# Patient Record
Sex: Male | Born: 1985 | Race: Black or African American | Hispanic: No | Marital: Single | Smoking: Former smoker
Health system: Southern US, Community
[De-identification: ages and names within clinical notes are randomized; demographics above are authoritative.]

## PROBLEM LIST (undated history)

## (undated) DIAGNOSIS — J309 Allergic rhinitis, unspecified: Secondary | ICD-10-CM

## (undated) DIAGNOSIS — M79609 Pain in unspecified limb: Secondary | ICD-10-CM

## (undated) DIAGNOSIS — F419 Anxiety disorder, unspecified: Secondary | ICD-10-CM

## (undated) HISTORY — DX: Pain in unspecified limb: M79.609

## (undated) HISTORY — DX: Anxiety disorder, unspecified: F41.9

## (undated) HISTORY — DX: Allergic rhinitis, unspecified: J30.9

---
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
## 2004-05-31 ENCOUNTER — Ambulatory Visit: Payer: Self-pay | Admitting: Internal Medicine

## 2005-10-12 ENCOUNTER — Emergency Department (HOSPITAL_COMMUNITY): Admission: EM | Admit: 2005-10-12 | Discharge: 2005-10-12 | Payer: Self-pay | Admitting: Family Medicine

## 2007-08-10 IMAGING — CR DG ABDOMEN 1V
3 series · 3 of 3 positions shown · non-contrast
Comparison: None.

CLINICAL DATA: Abdominal pain.

[view not recorded (1 of 3)]
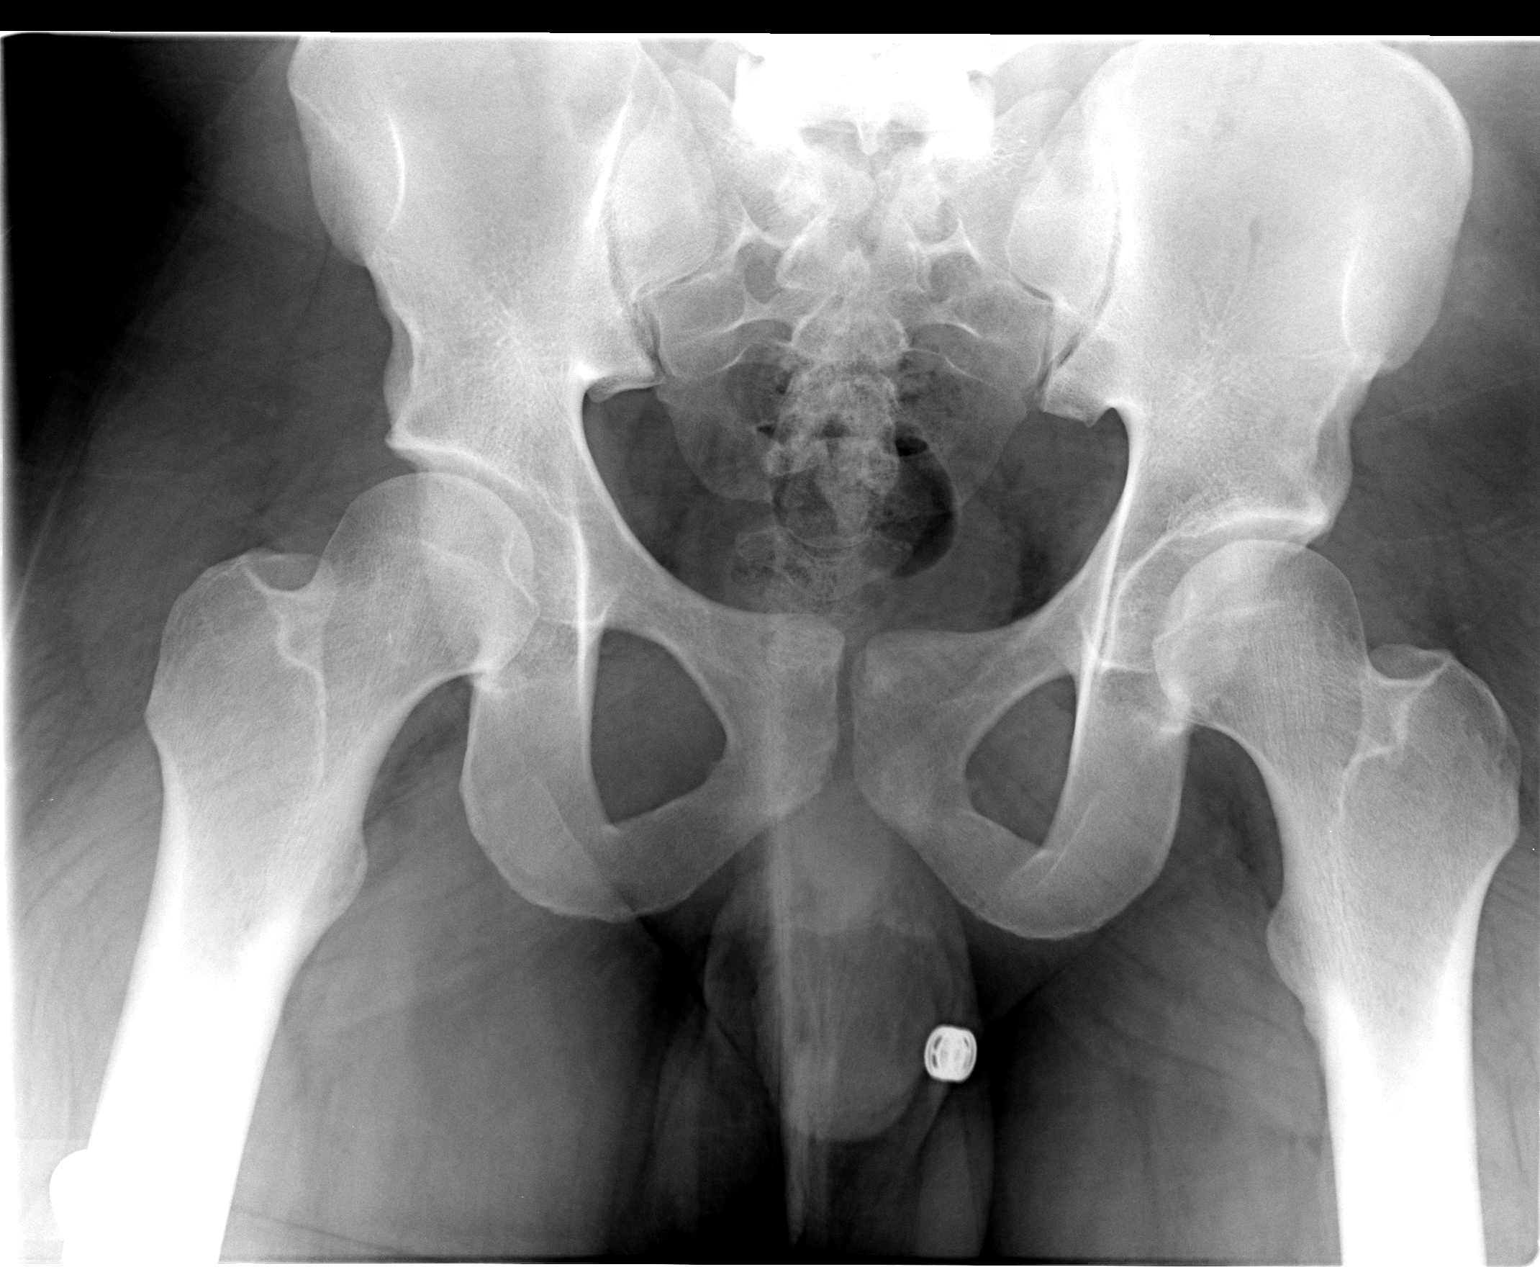

[view not recorded (2 of 3)]
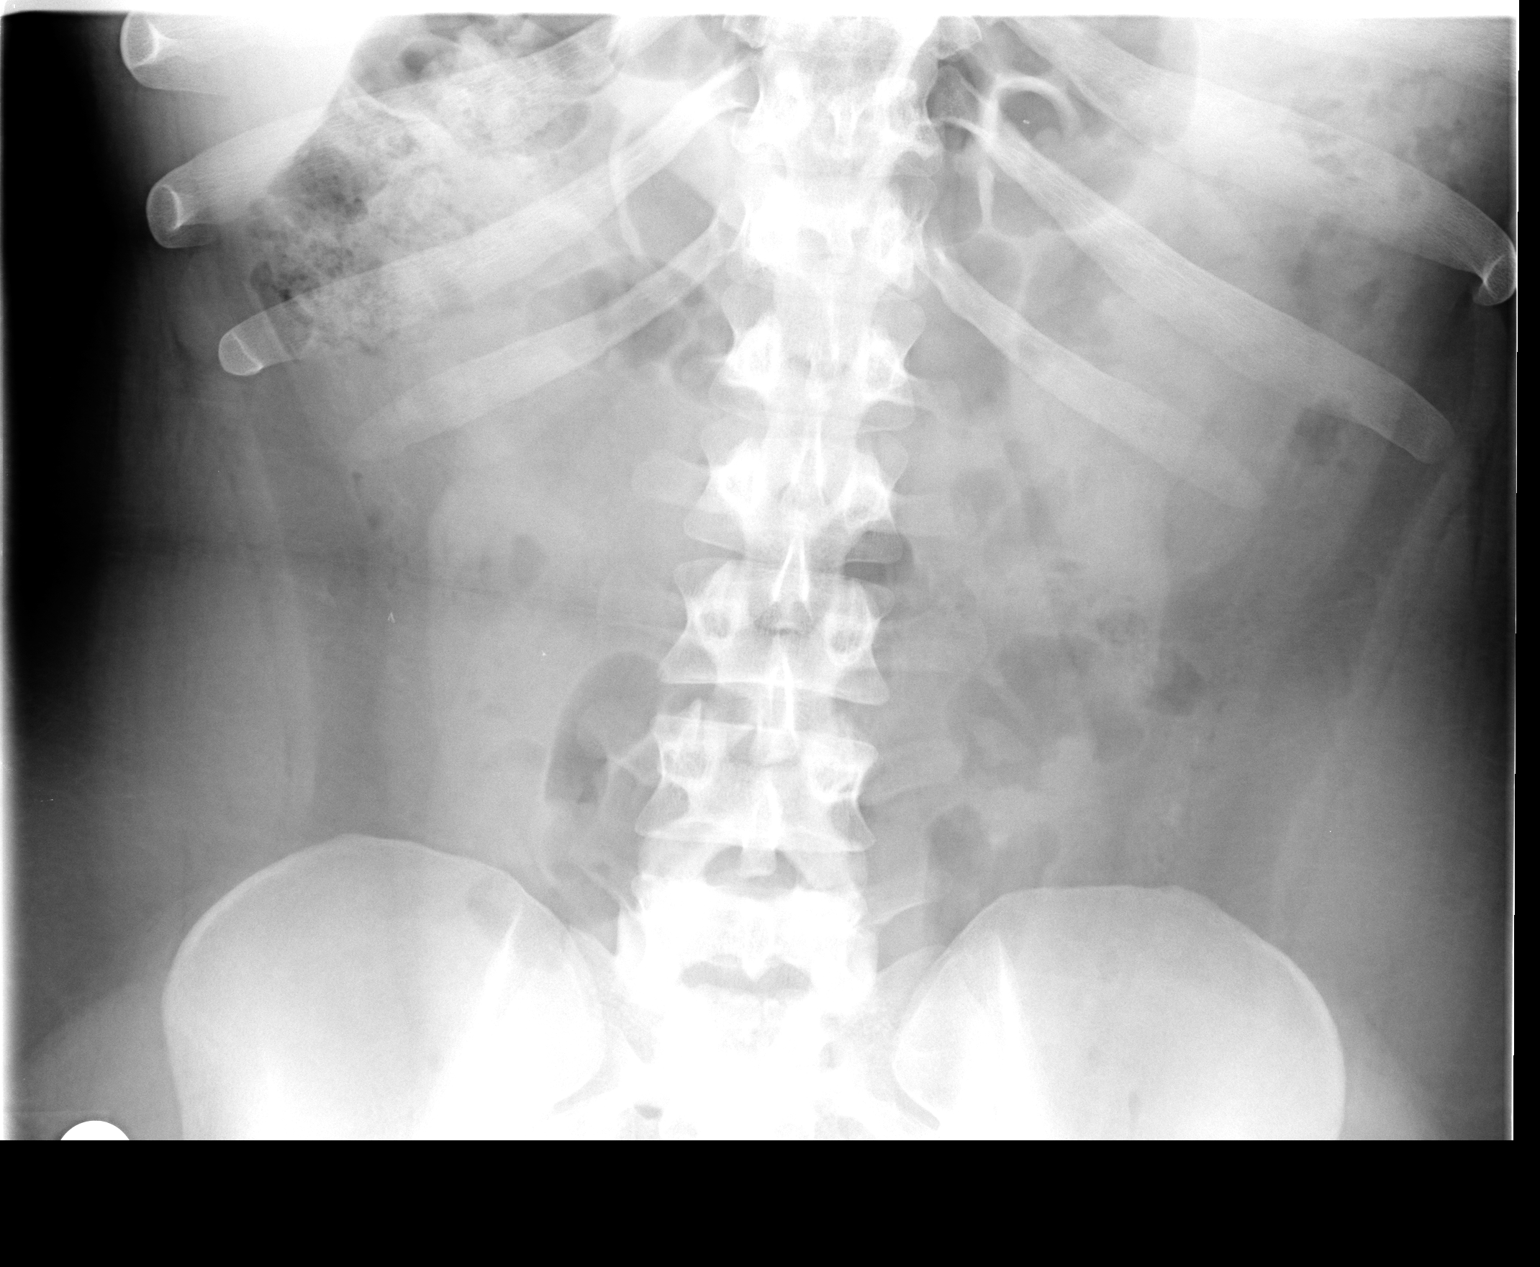

[view not recorded (3 of 3)]
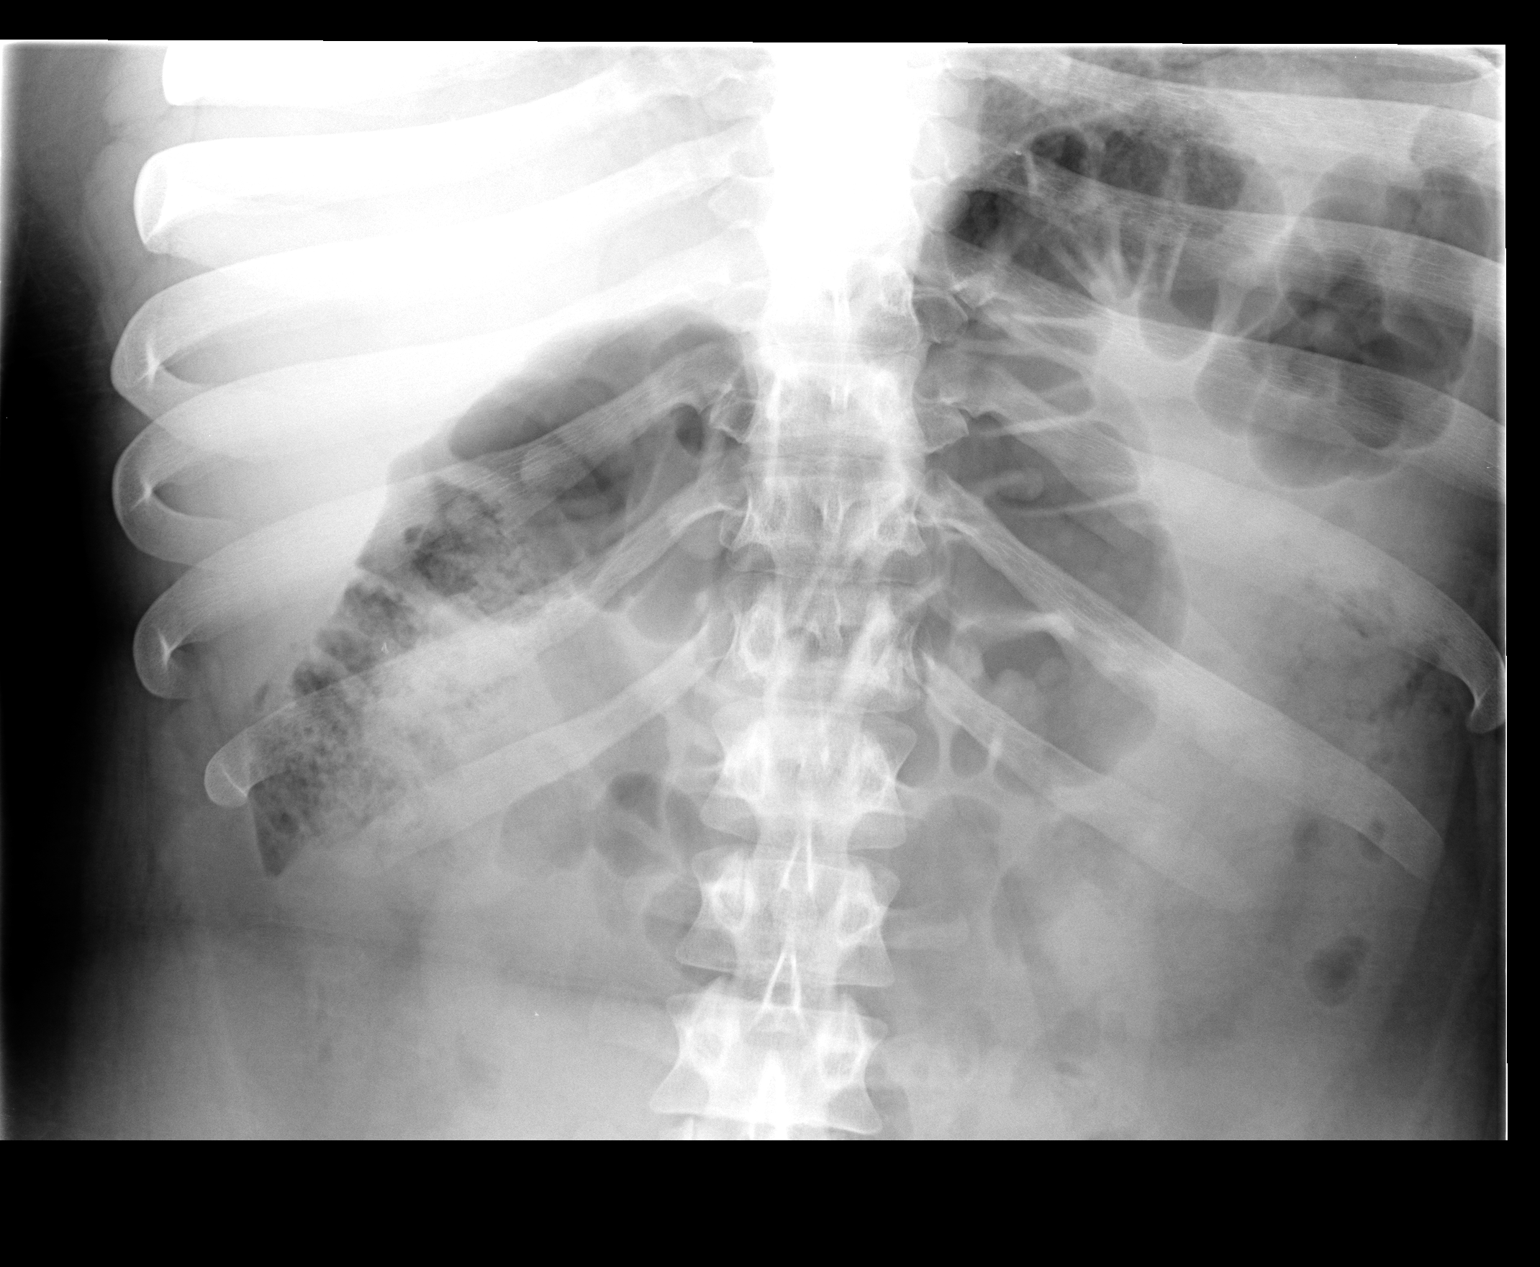

[3 of 3 positions shown; findings below may reference images not displayed]

ABDOMEN - 1 VIEW:

Supine film shows no evidence for bowel dilation to suggest obstruction.  No
free air is apparent although supine films can be insensitive for detection of
such.  Visualized bones are unremarkable
IMPRESSION: Nonspecific bowel gas pattern.

## 2008-04-22 ENCOUNTER — Emergency Department (HOSPITAL_COMMUNITY): Admission: EM | Admit: 2008-04-22 | Discharge: 2008-04-22 | Payer: Self-pay | Admitting: Emergency Medicine

## 2009-08-25 ENCOUNTER — Ambulatory Visit: Payer: Self-pay | Admitting: Internal Medicine

## 2009-08-25 DIAGNOSIS — J309 Allergic rhinitis, unspecified: Secondary | ICD-10-CM

## 2009-08-25 DIAGNOSIS — E559 Vitamin D deficiency, unspecified: Secondary | ICD-10-CM | POA: Insufficient documentation

## 2009-08-25 DIAGNOSIS — M79609 Pain in unspecified limb: Secondary | ICD-10-CM | POA: Insufficient documentation

## 2009-08-25 HISTORY — DX: Allergic rhinitis, unspecified: J30.9

## 2009-08-25 HISTORY — DX: Pain in unspecified limb: M79.609

## 2009-08-25 LAB — CONVERTED CEMR LAB
ALT: 19 units/L (ref 0–53)
AST: 19 units/L (ref 0–37)
Albumin: 4.3 g/dL (ref 3.5–5.2)
Alkaline Phosphatase: 45 units/L (ref 39–117)
BUN: 13 mg/dL (ref 6–23)
Basophils Absolute: 0 10*3/uL (ref 0.0–0.1)
Basophils Relative: 0.5 % (ref 0.0–3.0)
Bilirubin Urine: NEGATIVE
Bilirubin, Direct: 0.2 mg/dL (ref 0.0–0.3)
CO2: 31 meq/L (ref 19–32)
Calcium: 9.6 mg/dL (ref 8.4–10.5)
Chloride: 107 meq/L (ref 96–112)
Cholesterol: 147 mg/dL (ref 0–200)
Creatinine, Ser: 0.7 mg/dL (ref 0.4–1.5)
Eosinophils Absolute: 0.1 10*3/uL (ref 0.0–0.7)
Eosinophils Relative: 2.6 % (ref 0.0–5.0)
GFR calc non Af Amer: 167.74 mL/min (ref >60)
Glucose, Bld: 88 mg/dL (ref 70–99)
HCT: 41.3 % (ref 39.0–52.0)
HDL: 64 mg/dL (ref >39.00)
Hemoglobin: 14.1 g/dL (ref 13.0–17.0)
Ketones, ur: NEGATIVE mg/dL
LDL Cholesterol: 75 mg/dL (ref 0–99)
Leukocytes, UA: NEGATIVE
Lymphocytes Relative: 39.2 % (ref 12.0–46.0)
Lymphs Abs: 2 10*3/uL (ref 0.7–4.0)
MCHC: 34.2 g/dL (ref 30.0–36.0)
MCV: 91 fL (ref 78.0–100.0)
Monocytes Absolute: 0.5 10*3/uL (ref 0.1–1.0)
Monocytes Relative: 9.9 % (ref 3.0–12.0)
Neutro Abs: 2.4 10*3/uL (ref 1.4–7.7)
Neutrophils Relative %: 47.8 % (ref 43.0–77.0)
Nitrite: NEGATIVE
Platelets: 268 10*3/uL (ref 150.0–400.0)
Potassium: 4.5 meq/L (ref 3.5–5.1)
RBC: 4.54 M/uL (ref 4.22–5.81)
RDW: 13.8 % (ref 11.5–14.6)
Sodium: 143 meq/L (ref 135–145)
Specific Gravity, Urine: >=1.03 (ref 1.000–1.030)
TSH: 1.6 microintl units/mL (ref 0.35–5.50)
Total Bilirubin: 0.5 mg/dL (ref 0.3–1.2)
Total CHOL/HDL Ratio: 2
Total Protein, Urine: NEGATIVE mg/dL
Total Protein: 7.3 g/dL (ref 6.0–8.3)
Triglycerides: 42 mg/dL (ref 0.0–149.0)
Urine Glucose: NEGATIVE mg/dL
Urobilinogen, UA: 0.2 (ref 0.0–1.0)
VLDL: 8.4 mg/dL (ref 0.0–40.0)
WBC: 5 10*3/uL (ref 4.5–10.5)
pH: 5.5 (ref 5.0–8.0)

## 2009-08-26 LAB — CONVERTED CEMR LAB: Vit D, 25-Hydroxy: 12 ng/mL — ABNORMAL LOW (ref 30–89)

## 2009-10-01 ENCOUNTER — Emergency Department (HOSPITAL_BASED_OUTPATIENT_CLINIC_OR_DEPARTMENT_OTHER): Admission: EM | Admit: 2009-10-01 | Discharge: 2009-10-01 | Payer: Self-pay | Admitting: Emergency Medicine

## 2010-04-15 NOTE — Assessment & Plan Note (Signed)
Summary: NEW/ [employer]/ OK'D PER AVP/ BERNADETTE TUITT IS HIS MOM/NWS   Vital Signs:  Patient profile:   25 year old male Height:      71 inches Weight:      280.50 pounds BMI:     39.26 O2 Sat:      98 % on Room air Temp:     97.8 degrees F oral Pulse rate:   60 / minute BP sitting:   110 / 68  (left arm) Cuff size:   regular  Vitals Entered By: Lucious Groves (August 25, 2009 9:59 AM)  O2 Flow:  Room air CC: NP--Est. care./kb Is Patient Diabetic? No Pain Assessment Patient in pain? no      Comments Date of last TD is unknown./kb   CC:  NP--Est. care./kb.  History of Present Illness: The patient presents for a wellness examination  C/o L middle finger hurts  Preventive Screening-Counseling & Management  Alcohol-Tobacco     Smoking Status: quit > 6 months      Drug Use:  no.    Current Medications (verified): 1)  None  Allergies (verified): No Known Drug Allergies  Past History:  Past Medical History: Allergic rhinitis  Past Surgical History: none  Family History: M FMS  Social History: Occupation: [employer]. Single Former Smoker Alcohol use-no Drug use-no Smoking Status:  quit > 6 months Drug Use:  no  Review of Systems  The patient denies anorexia, fever, weight loss, weight gain, vision loss, decreased hearing, hoarseness, chest pain, syncope, dyspnea on exertion, peripheral edema, prolonged cough, headaches, hemoptysis, abdominal pain, melena, hematochezia, severe indigestion/heartburn, hematuria, incontinence, genital sores, muscle weakness, suspicious skin lesions, transient blindness, difficulty walking, depression, unusual weight change, abnormal bleeding, enlarged lymph nodes, angioedema, and testicular masses.         Loosing wt on diet  Physical Exam  General:  overweight-appearing.   Head:  Normocephalic and atraumatic without obvious abnormalities. No apparent alopecia or balding. Eyes:  No corneal or conjunctival inflammation noted.  EOMI. Perrla. Funduscopic exam benign, without hemorrhages, exudates or papilledema. Vision grossly normal. Ears:  External ear exam shows no significant lesions or deformities.  Otoscopic examination reveals clear canals, tympanic membranes are intact bilaterally without bulging, retraction, inflammation or discharge. Hearing is grossly normal bilaterally. Nose:  External nasal examination shows no deformity or inflammation. Nasal mucosa are pink and moist without lesions or exudates. Mouth:  Oral mucosa and oropharynx without lesions or exudates.  Teeth in good repair. Neck:  No deformities, masses, or tenderness noted. Chest Wall:  No deformities, masses, tenderness or gynecomastia noted. Lungs:  Normal respiratory effort, chest expands symmetrically. Lungs are clear to auscultation, no crackles or wheezes. Heart:  Normal rate and regular rhythm. S1 and S2 normal without gallop, murmur, click, rub or other extra sounds. Abdomen:  Bowel sounds positive,abdomen soft and non-tender without masses, organomegaly or hernias noted. Genitalia:  self exam nl Msk:  No deformity or scoliosis noted of thoracic or lumbar spine.   Pulses:  R and L carotid,radial,femoral,dorsalis pedis and posterior tibial pulses are full and equal bilaterally Extremities:  No clubbing, cyanosis, edema, or deformity noted with normal full range of motion of all joints.   Neurologic:  No cranial nerve deficits noted. Station and gait are normal. Plantar reflexes are down-going bilaterally. DTRs are symmetrical throughout. Sensory, motor and coordinative functions appear intact. Skin:  Intact without suspicious lesions or rashes Cervical Nodes:  No lymphadenopathy noted Inguinal Nodes:  No significant adenopathy Psych:  Cognition and judgment appear intact. Alert and cooperative with normal attention span and concentration. No apparent delusions, illusions, hallucinations   Impression & Recommendations:  Problem # 1:   PHYSICAL EXAMINATION (ICD-V70.0) Assessment New Health and age related issues were discussed. Available screening tests and vaccinations were discussed as well. Healthy life style including good diet and execise was discussed.  He is trying to loose wt so he could join the [employer] - 60 lbs Orders: TLB-BMP (Basic Metabolic Panel-BMET) (80048-METABOL) TLB-CBC Platelet - w/Differential (85025-CBCD) TLB-Hepatic/Liver Function Pnl (80076-HEPATIC) TLB-TSH (Thyroid Stimulating Hormone) (84443-TSH) T-Vitamin D (25-Hydroxy) (11914-78295) TLB-Udip ONLY (81003-UDIP) TLB-Lipid Panel (80061-LIPID) T-HIV Antibody  (Reflex) (62130-86578)  Problem # 2:  ALLERGIC RHINITIS (ICD-477.9) Assessment: New  His updated medication list for this problem includes:    Loratadine 10 Mg Tabs (Loratadine) .Marland Kitchen... 1 by mouth once daily as needed allergies    Flonase 50 Mcg/act Susp (Fluticasone propionate) .Marland Kitchen... 1 spr each nostr qd as needed  Problem # 3:  VITAMIN D DEFICIENCY (ICD-268.9) Assessment: New See rx  Problem # 4:  HAND PAIN (ICD-729.5) - finger Assessment: New Pennsaid prn  Complete Medication List: 1)  Loratadine 10 Mg Tabs (Loratadine) .Marland Kitchen.. 1 by mouth once daily as needed allergies 2)  Flonase 50 Mcg/act Susp (Fluticasone propionate) .Marland Kitchen.. 1 spr each nostr qd as needed 3)  Vitamin D 1000 Unit Tabs (Cholecalciferol) .Marland Kitchen.. 1 by mouth qd 4)  Vitamin D (ergocalciferol) 50000 Unit Caps (Ergocalciferol) .Marland Kitchen.. 1 by mouth q 1 week x 6 weeks then start vit d 1000 international units qd  Patient Instructions: 1)  Use the Sinus rinse as needed 2)  Please schedule a follow-up appointment in 1 year well w/labs. 3)  Try to eat more raw plant food, fresh and dry fruit, raw almonds, leafy vegetables, whole foods and less red meat, less animal fat. Poultry and fish is better for you than pork and beef. Avoid processed foods (canned soups, hot dogs, sausage, bacon , frozen dinners). Avoid corn syrup, high fructose syrup  or aspartam  containing drinks. Honey, Agave and Stevia are better sweeteners. Make your own  dressing with olive oil, wine vinegar, lemon juce, garlic etc. for your salads.  Prescriptions: VITAMIN D (ERGOCALCIFEROL) 50000 UNIT CAPS (ERGOCALCIFEROL) 1 by mouth q 1 week x 6 weeks then start Vit D 1000 international units qd  #6 x 0   Entered and Authorized by:   Tresa Garter MD   Signed by:   Tresa Garter MD on 08/26/2009   Method used:   Print then Give to Patient   RxID:   [id number] FLONASE 50 MCG/ACT SUSP (FLUTICASONE PROPIONATE) 1 spr each nostr qd as needed  #3 x 3   Entered and Authorized by:   Tresa Garter MD   Signed by:   Tresa Garter MD on 08/25/2009   Method used:   Print then Give to Patient   RxID:   [id number] LORATADINE 10 MG TABS (LORATADINE) 1 by mouth once daily as needed allergies  #30 x 6   Entered and Authorized by:   Tresa Garter MD   Signed by:   Tresa Garter MD on 08/25/2009   Method used:   Print then Give to Patient   RxID:   [id number]   Appended Document: NEW/ UHC/ OK'D PER AVP/ BERNADETTE TUITT IS HIS MOM/NWS Maralyn Sago, please inform - he can pick up Pennsaid sample for finger pain 1 gtt three times a day  on skin  Thanks, AP   Appended Document: NEW/ UHC/ OK'D PER AVP/ BERNADETTE TUITT IS HIS MOM/NWS Pt informed

## 2010-06-29 LAB — COMPREHENSIVE METABOLIC PANEL
Albumin: 3.9 g/dL (ref 3.5–5.2)
BUN: 7 mg/dL (ref 6–23)
Creatinine, Ser: 0.9 mg/dL (ref 0.4–1.5)
Total Protein: 6.9 g/dL (ref 6.0–8.3)

## 2010-06-29 LAB — RAPID URINE DRUG SCREEN, HOSP PERFORMED
Amphetamines: NOT DETECTED
Barbiturates: NOT DETECTED
Benzodiazepines: POSITIVE — AB
Cocaine: NOT DETECTED
Opiates: NOT DETECTED
Tetrahydrocannabinol: NOT DETECTED

## 2010-06-29 LAB — DIFFERENTIAL
Basophils Absolute: 0 10*3/uL (ref 0.0–0.1)
Lymphocytes Relative: 21 % (ref 12–46)
Monocytes Absolute: 0.9 10*3/uL (ref 0.1–1.0)
Monocytes Relative: 12 % (ref 3–12)
Neutro Abs: 5.1 10*3/uL (ref 1.7–7.7)

## 2010-06-29 LAB — CBC
HCT: 39.6 % (ref 39.0–52.0)
MCHC: 33.9 g/dL (ref 30.0–36.0)
MCV: 90.7 fL (ref 78.0–100.0)
Platelets: 267 10*3/uL (ref 150–400)
RDW: 13.2 % (ref 11.5–15.5)

## 2010-06-29 LAB — ETHANOL: Alcohol, Ethyl (B): 5 mg/dL (ref 0–10)

## 2011-05-30 ENCOUNTER — Ambulatory Visit (INDEPENDENT_AMBULATORY_CARE_PROVIDER_SITE_OTHER): Payer: 59 | Admitting: Endocrinology

## 2011-05-30 ENCOUNTER — Encounter: Payer: Self-pay | Admitting: Endocrinology

## 2011-05-30 VITALS — BP 130/74 | HR 69 | Temp 98.4°F | Ht 71.0 in | Wt 310.0 lb

## 2011-05-30 DIAGNOSIS — S0003XA Contusion of scalp, initial encounter: Secondary | ICD-10-CM

## 2011-05-30 DIAGNOSIS — S0083XA Contusion of other part of head, initial encounter: Secondary | ICD-10-CM

## 2011-05-30 NOTE — Patient Instructions (Addendum)
I hope you feel better soon.  If you don't feel better by next week, please call dr Barnetta Chapel. Head Injury, Adult You have had a head injury that does not appear serious at this time. You should resume your regular diet. You should not take sedatives or alcoholic beverages for as long as directed by your caregiver after discharge. After injuries such as yours, most problems occur within the first 24 hours. SYMPTOMS These minor symptoms may be experienced after discharge:  Memory difficulties.   Dizziness.   Headaches.   Double vision.   Hearing difficulties.   Depression.   Tiredness.   Weakness.   Difficulty with concentration.  If you experience any of these problems, you should not be alarmed.  Many patients with head injuries frequently experience such symptoms. Usually, these problems disappear without medical care. If symptoms last for more than one day, notify your caregiver. See your caregiver sooner if symptoms are becoming worse rather than better. HOME CARE INSTRUCTIONS Although it is unlikely that serious side effects will occur, you should be aware of signs and symptoms which may necessitate your return to this location. Side effects may occur up to 7 - 10 days following the injury. It is important for you to carefully monitor your condition and contact your caregiver or seek immediate medical attention if there is a change in your condition. SEEK IMMEDIATE MEDICAL CARE IF:    There is confusion or drowsiness.   You can not awaken the injured person.   There is more nausea (feeling sick to your stomach) or continued, forceful vomiting.   You notice dizziness or unsteadiness which is getting worse, or inability to walk.   You have convulsions or unconsciousness.   You experience severe, persistent headaches not relieved by over-the-counter or prescription medicines for pain. (Do not take aspirin as this impairs clotting abilities). Take other pain medications only as  directed.   You can not use arms or legs normally.   There is clear or bloody discharge from the nose or ears.  MAKE SURE YOU:    Understand these instructions.   Will watch your condition.   Will get help right away if you are not doing well or get worse.  Document Released: 02/28/2005 Document Revised: 02/17/2011 Document Reviewed: 01/16/2009 [facility] Patient Information 2012 [place], [place].

## 2011-05-30 NOTE — Progress Notes (Signed)
  Subjective:    Patient ID: Tommy [name], male    DOB: 01-29-1986, 26 y.o.   MRN: [id number]  HPI Yesterday, pt was playing basketball.  He felt himself being struck at the right preauricular area.  He is uncertain what hit him.  No one else was injured.  He feels he may have been struck by the foot of another player.  Since then, he has slight headache there, and assoc "decreased reaction time."  He vomited x 1, but has no nausea since then.   Past Medical History  Diagnosis Date  . ALLERGIC RHINITIS 08/25/2009    Qualifier: Diagnosis of  By: Plotnikov MD, Georgina Quint   . HAND PAIN 08/25/2009    Qualifier: Diagnosis of  By: Plotnikov MD, Georgina Quint     No past surgical history on file.  History   Social History  . Marital Status: Single    Spouse Name: N/A    Number of Children: N/A  . Years of Education: N/A   Occupational History  .  [employer]   Social History Main Topics  . Smoking status: Former Games developer  . Smokeless tobacco: Not on file  . Alcohol Use: No  . Drug Use: No  . Sexually Active: Not on file   Other Topics Concern  . Not on file   Social History Narrative  . No narrative on file    No current outpatient prescriptions on file prior to visit.    Allergies not on file  No family history on file.  BP 130/74  Pulse 69  Temp(Src) 98.4 F (36.9 C) (Oral)  Ht 5\' 11"  (1.803 m)  Wt 310 lb (140.615 kg)  BMI 43.24 kg/m2  SpO2 98%    Review of Systems Denies loc and visual loss.      Objective:   Physical Exam VS: see vs page GEN: no distress HEAD: head: no deformity eyes: no periorbital swelling, no proptosis external nose and ears are normal mouth: no lesion seen Both eac's and tm's are normal NECK: supple.   MUSCULOSKELETAL: gait is normal and steady NEURO:  cn 2-12 grossly intact.   readily moves all 4's.  sensation is intact to touch on the feet PSYCH: alert, oriented x3.  Does not appear anxious nor depressed.         Assessment & Plan:  Facial contusion, new.  No evidence of brain injury

## 2011-06-01 DIAGNOSIS — S0083XA Contusion of other part of head, initial encounter: Secondary | ICD-10-CM | POA: Insufficient documentation

## 2011-06-23 ENCOUNTER — Telehealth: Payer: Self-pay | Admitting: *Deleted

## 2011-06-23 DIAGNOSIS — Z0000 Encounter for general adult medical examination without abnormal findings: Secondary | ICD-10-CM

## 2011-06-23 NOTE — Telephone Encounter (Signed)
June CPE labs entered.  

## 2011-06-23 NOTE — Telephone Encounter (Signed)
Message copied by Merrilyn Puma on Thu Jun 23, 2011  8:19 AM ------      Message from: COUSIN, SHARON T      Created: Mon May 30, 2011  4:24 PM      Regarding: PHY DATE:  08/23/11       THANKS

## 2011-08-23 ENCOUNTER — Encounter: Payer: 59 | Admitting: Internal Medicine

## 2011-11-07 ENCOUNTER — Other Ambulatory Visit (INDEPENDENT_AMBULATORY_CARE_PROVIDER_SITE_OTHER): Payer: 59

## 2011-11-07 DIAGNOSIS — Z0000 Encounter for general adult medical examination without abnormal findings: Secondary | ICD-10-CM

## 2011-11-07 LAB — HEPATIC FUNCTION PANEL
Alkaline Phosphatase: 43 U/L (ref 39–117)
Bilirubin, Direct: 0.1 mg/dL (ref 0.0–0.3)
Total Bilirubin: 0.7 mg/dL (ref 0.3–1.2)

## 2011-11-07 LAB — LIPID PANEL
HDL: 59.1 mg/dL (ref >39.00)
LDL Cholesterol: 71 mg/dL (ref 0–99)
Total CHOL/HDL Ratio: 2
VLDL: 4.6 mg/dL (ref 0.0–40.0)

## 2011-11-07 LAB — BASIC METABOLIC PANEL
CO2: 25 mEq/L (ref 19–32)
Calcium: 9.3 mg/dL (ref 8.4–10.5)
Creatinine, Ser: 0.8 mg/dL (ref 0.4–1.5)
GFR: 152.8 mL/min (ref >60.00)
Sodium: 141 mEq/L (ref 135–145)

## 2011-11-07 LAB — CBC WITH DIFFERENTIAL/PLATELET
Basophils Absolute: 0 10*3/uL (ref 0.0–0.1)
Basophils Relative: 0.5 % (ref 0.0–3.0)
HCT: 41.9 % (ref 39.0–52.0)
Hemoglobin: 13.7 g/dL (ref 13.0–17.0)
Lymphs Abs: 1.8 10*3/uL (ref 0.7–4.0)
MCHC: 32.6 g/dL (ref 30.0–36.0)
Monocytes Relative: 6.5 % (ref 3.0–12.0)
Neutro Abs: 2.9 10*3/uL (ref 1.4–7.7)
RBC: 4.59 Mil/uL (ref 4.22–5.81)
RDW: 13.9 % (ref 11.5–14.6)

## 2011-11-07 LAB — URINALYSIS, ROUTINE W REFLEX MICROSCOPIC
Ketones, ur: NEGATIVE
Specific Gravity, Urine: >=1.03 (ref 1.000–1.030)
Total Protein, Urine: NEGATIVE
Urine Glucose: NEGATIVE
pH: 5.5 (ref 5.0–8.0)

## 2011-11-09 ENCOUNTER — Ambulatory Visit (INDEPENDENT_AMBULATORY_CARE_PROVIDER_SITE_OTHER): Payer: 59 | Admitting: Internal Medicine

## 2011-11-09 ENCOUNTER — Encounter: Payer: Self-pay | Admitting: Internal Medicine

## 2011-11-09 VITALS — BP 108/80 | HR 76 | Temp 98.4°F | Resp 16 | Ht 71.0 in | Wt 297.0 lb

## 2011-11-09 DIAGNOSIS — L218 Other seborrheic dermatitis: Secondary | ICD-10-CM

## 2011-11-09 DIAGNOSIS — F411 Generalized anxiety disorder: Secondary | ICD-10-CM

## 2011-11-09 DIAGNOSIS — F418 Other specified anxiety disorders: Secondary | ICD-10-CM | POA: Insufficient documentation

## 2011-11-09 DIAGNOSIS — E669 Obesity, unspecified: Secondary | ICD-10-CM | POA: Insufficient documentation

## 2011-11-09 DIAGNOSIS — L210 Seborrhea capitis: Secondary | ICD-10-CM | POA: Insufficient documentation

## 2011-11-09 DIAGNOSIS — Z0000 Encounter for general adult medical examination without abnormal findings: Secondary | ICD-10-CM | POA: Insufficient documentation

## 2011-11-09 DIAGNOSIS — Z23 Encounter for immunization: Secondary | ICD-10-CM

## 2011-11-09 MED ORDER — VITAMIN D 1000 UNITS PO TABS: 1000.0000 [IU] | ORAL_TABLET | Freq: Every day | ORAL | Status: DC

## 2011-11-09 MED ORDER — PAROXETINE HCL 10 MG PO TABS
10.0000 mg | ORAL_TABLET | Freq: Every day | ORAL | Status: DC
Start: 2011-11-09 — End: 2012-02-15

## 2011-11-09 MED ORDER — KETOCONAZOLE 2 % EX SHAM: MEDICATED_SHAMPOO | CUTANEOUS | Status: DC

## 2011-11-09 NOTE — Progress Notes (Signed)
  Subjective:    Patient ID: Tommy [name], male    DOB: 24-Aug-1985, 26 y.o.   MRN: [id number]  HPI  The patient is here for a wellness exam. The patient has been doing well overall without major physical or psychological issues going on lately except for premat ejaculation, anxiety, mild insomnia  BP Readings from Last 3 Encounters:  11/09/11 108/80  05/30/11 130/74  08/25/09 110/68   Wt Readings from Last 3 Encounters:  11/09/11 297 lb (134.718 kg)  05/30/11 310 lb (140.615 kg)  08/25/09 280 lb 8 oz (127.234 kg)     Review of Systems  Constitutional: Negative for appetite change, fatigue and unexpected weight change.  HENT: Negative for nosebleeds, congestion, sore throat, sneezing, trouble swallowing and neck pain.   Eyes: Negative for itching and visual disturbance.  Respiratory: Negative for cough.   Cardiovascular: Negative for chest pain, palpitations and leg swelling.  Gastrointestinal: Negative for nausea, diarrhea, blood in stool and abdominal distention.  Genitourinary: Negative for frequency and hematuria.  Musculoskeletal: Negative for back pain, joint swelling and gait problem.  Skin: Negative for rash.  Neurological: Negative for dizziness, tremors, speech difficulty and weakness.  Psychiatric/Behavioral: Positive for disturbed wake/sleep cycle. Negative for suicidal ideas, confusion, dysphoric mood, decreased concentration and agitation. The patient is nervous/anxious.        Objective:   Physical Exam  Constitutional: He is oriented to person, place, and time. He appears well-developed.       Obese   HENT:  Mouth/Throat: Oropharynx is clear and moist.  Eyes: Conjunctivae are normal. Pupils are equal, round, and reactive to light.  Neck: Normal range of motion. No JVD present. No thyromegaly present.  Cardiovascular: Normal rate, regular rhythm, normal heart sounds and intact distal pulses.  Exam reveals no gallop and no friction rub.   No murmur  heard. Pulmonary/Chest: Effort normal and breath sounds normal. No respiratory distress. He has no wheezes. He has no rales. He exhibits no tenderness.  Abdominal: Soft. Bowel sounds are normal. He exhibits no distension and no mass. There is no tenderness. There is no rebound and no guarding.  Genitourinary:       Testes nl B - self-exam  Musculoskeletal: Normal range of motion. He exhibits no edema and no tenderness.  Lymphadenopathy:    He has no cervical adenopathy.  Neurological: He is alert and oriented to person, place, and time. He has normal reflexes. No cranial nerve deficit. He exhibits normal muscle tone. Coordination normal.  Skin: Skin is warm and dry. No rash noted.       Flaky scalp  Psychiatric: He has a normal mood and affect. His behavior is normal. Judgment and thought content normal.   Lab Results  Component Value Date   WBC 5.1 11/07/2011   HGB 13.7 11/07/2011   HCT 41.9 11/07/2011   PLT 280.0 11/07/2011   GLUCOSE 89 11/07/2011   CHOL 135 11/07/2011   TRIG 23.0 11/07/2011   HDL 59.10 11/07/2011   LDLCALC 71 11/07/2011   ALT 23 11/07/2011   AST 23 11/07/2011   NA 141 11/07/2011   K 4.0 11/07/2011   CL 108 11/07/2011   CREATININE 0.8 11/07/2011   BUN 12 11/07/2011   CO2 25 11/07/2011   TSH 1.48 11/07/2011          Assessment & Plan:

## 2011-11-09 NOTE — Assessment & Plan Note (Signed)
Ketocon shampoo 2/wk

## 2011-11-09 NOTE — Assessment & Plan Note (Signed)
8/13 We discussed age appropriate health related issues, including available/recomended screening tests and vaccinations. We discussed a need for adhering to healthy diet and exercise. Labs/EKG were reviewed/ordered. All questions were answered.

## 2011-11-09 NOTE — Assessment & Plan Note (Signed)
8/13 mild -Issues w/premature  -insomnia Will try Paxil

## 2012-02-15 ENCOUNTER — Ambulatory Visit (INDEPENDENT_AMBULATORY_CARE_PROVIDER_SITE_OTHER): Payer: 59 | Admitting: Internal Medicine

## 2012-02-15 ENCOUNTER — Encounter: Payer: Self-pay | Admitting: Internal Medicine

## 2012-02-15 VITALS — BP 110/70 | HR 76 | Temp 98.0°F | Resp 16 | Wt 292.0 lb

## 2012-02-15 DIAGNOSIS — L218 Other seborrheic dermatitis: Secondary | ICD-10-CM

## 2012-02-15 DIAGNOSIS — F411 Generalized anxiety disorder: Secondary | ICD-10-CM

## 2012-02-15 DIAGNOSIS — E559 Vitamin D deficiency, unspecified: Secondary | ICD-10-CM

## 2012-02-15 DIAGNOSIS — L210 Seborrhea capitis: Secondary | ICD-10-CM

## 2012-02-15 DIAGNOSIS — F418 Other specified anxiety disorders: Secondary | ICD-10-CM

## 2012-02-15 DIAGNOSIS — E669 Obesity, unspecified: Secondary | ICD-10-CM

## 2012-02-15 MED ORDER — PAROXETINE HCL 10 MG PO TABS: 20.0000 mg | ORAL_TABLET | Freq: Every day | ORAL | Status: DC

## 2012-02-15 NOTE — Assessment & Plan Note (Signed)
Continue with current prescription therapy as reflected on the Med list.  

## 2012-02-15 NOTE — Progress Notes (Signed)
  Subjective:    Patient ID: Tommy [name], male    DOB: February 14, 1986, 26 y.o.   MRN: [id number]  HPI  F/u on premat ejaculation, anxiety, mild insomnia, dandruff. Doing better  BP Readings from Last 3 Encounters:  02/15/12 110/70  11/09/11 108/80  05/30/11 130/74   Wt Readings from Last 3 Encounters:  02/15/12 292 lb (132.45 kg)  11/09/11 297 lb (134.718 kg)  05/30/11 310 lb (140.615 kg)     Review of Systems  Constitutional: Negative for appetite change, fatigue and unexpected weight change.  HENT: Negative for nosebleeds, congestion, sore throat, sneezing, trouble swallowing and neck pain.   Eyes: Negative for itching and visual disturbance.  Respiratory: Negative for cough.   Cardiovascular: Negative for chest pain, palpitations and leg swelling.  Gastrointestinal: Negative for nausea, diarrhea, blood in stool and abdominal distention.  Genitourinary: Negative for frequency and hematuria.  Musculoskeletal: Negative for back pain, joint swelling and gait problem.  Skin: Negative for rash.  Neurological: Negative for dizziness, tremors, speech difficulty and weakness.  Psychiatric/Behavioral: Positive for sleep disturbance. Negative for suicidal ideas, confusion, dysphoric mood, decreased concentration and agitation. The patient is nervous/anxious.        Objective:   Physical Exam  Constitutional: He is oriented to person, place, and time. He appears well-developed.       Obese   HENT:  Mouth/Throat: Oropharynx is clear and moist.  Eyes: Conjunctivae normal are normal. Pupils are equal, round, and reactive to light.  Neck: Normal range of motion. No JVD present. No thyromegaly present.  Cardiovascular: Normal rate, regular rhythm, normal heart sounds and intact distal pulses.  Exam reveals no gallop and no friction rub.   No murmur heard. Pulmonary/Chest: Effort normal and breath sounds normal. No respiratory distress. He has no wheezes. He has no rales. He exhibits no  tenderness.  Abdominal: Soft. Bowel sounds are normal. He exhibits no distension and no mass. There is no tenderness. There is no rebound and no guarding.  Genitourinary:       Testes nl B - self-exam  Musculoskeletal: Normal range of motion. He exhibits no edema and no tenderness.  Lymphadenopathy:    He has no cervical adenopathy.  Neurological: He is alert and oriented to person, place, and time. He has normal reflexes. No cranial nerve deficit. He exhibits normal muscle tone. Coordination normal.  Skin: Skin is warm and dry. No rash noted.       Nl scalp  Psychiatric: He has a normal mood and affect. His behavior is normal. Judgment and thought content normal.   Lab Results  Component Value Date   WBC 5.1 11/07/2011   HGB 13.7 11/07/2011   HCT 41.9 11/07/2011   PLT 280.0 11/07/2011   GLUCOSE 89 11/07/2011   CHOL 135 11/07/2011   TRIG 23.0 11/07/2011   HDL 59.10 11/07/2011   LDLCALC 71 11/07/2011   ALT 23 11/07/2011   AST 23 11/07/2011   NA 141 11/07/2011   K 4.0 11/07/2011   CL 108 11/07/2011   CREATININE 0.8 11/07/2011   BUN 12 11/07/2011   CO2 25 11/07/2011   TSH 1.48 11/07/2011          Assessment & Plan:

## 2012-02-15 NOTE — Assessment & Plan Note (Signed)
Better Continue with current prescription therapy as reflected on the Med list.  

## 2012-02-15 NOTE — Assessment & Plan Note (Signed)
He lost 5 lbs

## 2012-02-15 NOTE — Assessment & Plan Note (Signed)
Better We can try to use 20 mg Paxil a day

## 2012-08-17 ENCOUNTER — Ambulatory Visit (INDEPENDENT_AMBULATORY_CARE_PROVIDER_SITE_OTHER): Payer: 59 | Admitting: Internal Medicine

## 2012-08-17 ENCOUNTER — Encounter: Payer: Self-pay | Admitting: Internal Medicine

## 2012-08-17 VITALS — BP 120/90 | HR 80 | Temp 98.0°F | Resp 16 | Wt 311.0 lb

## 2012-08-17 DIAGNOSIS — F411 Generalized anxiety disorder: Secondary | ICD-10-CM

## 2012-08-17 DIAGNOSIS — E669 Obesity, unspecified: Secondary | ICD-10-CM

## 2012-08-17 DIAGNOSIS — F418 Other specified anxiety disorders: Secondary | ICD-10-CM

## 2012-08-17 DIAGNOSIS — L218 Other seborrheic dermatitis: Secondary | ICD-10-CM

## 2012-08-17 DIAGNOSIS — L210 Seborrhea capitis: Secondary | ICD-10-CM

## 2012-08-17 DIAGNOSIS — E559 Vitamin D deficiency, unspecified: Secondary | ICD-10-CM

## 2012-08-17 DIAGNOSIS — J309 Allergic rhinitis, unspecified: Secondary | ICD-10-CM

## 2012-08-17 MED ORDER — KETOCONAZOLE 2 % EX SHAM: MEDICATED_SHAMPOO | CUTANEOUS | Status: DC

## 2012-08-17 MED ORDER — PAROXETINE HCL 10 MG PO TABS: 20.0000 mg | ORAL_TABLET | Freq: Every day | ORAL | Status: DC

## 2012-08-17 MED ORDER — VITAMIN D 1000 UNITS PO TABS: 1000.0000 [IU] | ORAL_TABLET | Freq: Every day | ORAL | Status: AC

## 2012-08-17 MED ORDER — ERGOCALCIFEROL 1.25 MG (50000 UT) PO CAPS: 50000.0000 [IU] | ORAL_CAPSULE | ORAL | Status: DC

## 2012-08-17 NOTE — Patient Instructions (Signed)
Wt Readings from Last 3 Encounters:  08/17/12 311 lb (141.069 kg)  02/15/12 292 lb (132.45 kg)  11/09/11 297 lb (134.718 kg)

## 2012-08-17 NOTE — Assessment & Plan Note (Signed)
Continue with current prescription therapy as reflected on the Med list.  

## 2012-08-17 NOTE — Assessment & Plan Note (Signed)
Re-start Vit D 

## 2012-08-17 NOTE — Progress Notes (Signed)
Patient ID: Tommy [name], male   DOB: 03/28/1985, 27 y.o.   MRN: [id number]  Subjective:    Patient ID: Tommy [name], male    DOB: 1985/06/25, 27 y.o.   MRN: [id number]  HPI  F/u on premat ejaculation, anxiety, mild insomnia, dandruff. Doing better  BP Readings from Last 3 Encounters:  08/17/12 120/90  02/15/12 110/70  11/09/11 108/80   Wt Readings from Last 3 Encounters:  08/17/12 311 lb (141.069 kg)  02/15/12 292 lb (132.45 kg)  11/09/11 297 lb (134.718 kg)     Review of Systems  Constitutional: Negative for appetite change, fatigue and unexpected weight change.  HENT: Negative for nosebleeds, congestion, sore throat, sneezing, trouble swallowing and neck pain.   Eyes: Negative for itching and visual disturbance.  Respiratory: Negative for cough.   Cardiovascular: Negative for chest pain, palpitations and leg swelling.  Gastrointestinal: Negative for nausea, diarrhea, blood in stool and abdominal distention.  Genitourinary: Negative for frequency and hematuria.  Musculoskeletal: Negative for back pain, joint swelling and gait problem.  Skin: Negative for rash.  Neurological: Negative for dizziness, tremors, speech difficulty and weakness.  Psychiatric/Behavioral: Positive for sleep disturbance. Negative for suicidal ideas, confusion, dysphoric mood, decreased concentration and agitation. The patient is nervous/anxious.        Objective:   Physical Exam  Constitutional: He is oriented to person, place, and time. He appears well-developed.  Obese   HENT:  Mouth/Throat: Oropharynx is clear and moist.  Eyes: Conjunctivae are normal. Pupils are equal, round, and reactive to light.  Neck: Normal range of motion. No JVD present. No thyromegaly present.  Cardiovascular: Normal rate, regular rhythm, normal heart sounds and intact distal pulses.  Exam reveals no gallop and no friction rub.   No murmur heard. Pulmonary/Chest: Effort normal and breath sounds normal. No  respiratory distress. He has no wheezes. He has no rales. He exhibits no tenderness.  Abdominal: Soft. Bowel sounds are normal. He exhibits no distension and no mass. There is no tenderness. There is no rebound and no guarding.  Genitourinary:  Testes nl B - self-exam  Musculoskeletal: Normal range of motion. He exhibits no edema and no tenderness.  Lymphadenopathy:    He has no cervical adenopathy.  Neurological: He is alert and oriented to person, place, and time. He has normal reflexes. No cranial nerve deficit. He exhibits normal muscle tone. Coordination normal.  Skin: Skin is warm and dry. No rash noted.  Nl scalp  Psychiatric: He has a normal mood and affect. His behavior is normal. Judgment and thought content normal.   Lab Results  Component Value Date   WBC 5.1 11/07/2011   HGB 13.7 11/07/2011   HCT 41.9 11/07/2011   PLT 280.0 11/07/2011   GLUCOSE 89 11/07/2011   CHOL 135 11/07/2011   TRIG 23.0 11/07/2011   HDL 59.10 11/07/2011   LDLCALC 71 11/07/2011   ALT 23 11/07/2011   AST 23 11/07/2011   NA 141 11/07/2011   K 4.0 11/07/2011   CL 108 11/07/2011   CREATININE 0.8 11/07/2011   BUN 12 11/07/2011   CO2 25 11/07/2011   TSH 1.48 11/07/2011          Assessment & Plan:

## 2012-08-17 NOTE — Assessment & Plan Note (Signed)
Better Continue with current prescription therapy as reflected on the Med list.  

## 2012-08-17 NOTE — Assessment & Plan Note (Signed)
Wt Readings from Last 3 Encounters:  08/17/12 311 lb (141.069 kg)  02/15/12 292 lb (132.45 kg)  11/09/11 297 lb (134.718 kg)  Diet discussed

## 2012-08-23 ENCOUNTER — Telehealth: Payer: Self-pay | Admitting: *Deleted

## 2012-08-23 DIAGNOSIS — Z0000 Encounter for general adult medical examination without abnormal findings: Secondary | ICD-10-CM

## 2012-08-23 NOTE — Telephone Encounter (Signed)
CPE labs entered.  

## 2012-08-23 NOTE — Telephone Encounter (Signed)
Message copied by Merrilyn Puma on Thu Aug 23, 2012  1:58 PM ------      Message from: Etheleen Sia      Created: Fri Aug 17, 2012  8:07 AM      Regarding: LABS       PHYSICAL LABS FOR DEC 2 APPT ------

## 2013-02-12 ENCOUNTER — Encounter: Payer: Self-pay | Admitting: Internal Medicine

## 2013-02-12 ENCOUNTER — Other Ambulatory Visit (INDEPENDENT_AMBULATORY_CARE_PROVIDER_SITE_OTHER): Payer: 59

## 2013-02-12 ENCOUNTER — Ambulatory Visit (INDEPENDENT_AMBULATORY_CARE_PROVIDER_SITE_OTHER): Payer: 59 | Admitting: Internal Medicine

## 2013-02-12 VITALS — BP 118/80 | HR 76 | Temp 97.9°F | Resp 16 | Ht 70.75 in | Wt 325.0 lb

## 2013-02-12 DIAGNOSIS — Z23 Encounter for immunization: Secondary | ICD-10-CM

## 2013-02-12 DIAGNOSIS — E669 Obesity, unspecified: Secondary | ICD-10-CM

## 2013-02-12 DIAGNOSIS — F411 Generalized anxiety disorder: Secondary | ICD-10-CM

## 2013-02-12 DIAGNOSIS — Z0000 Encounter for general adult medical examination without abnormal findings: Secondary | ICD-10-CM

## 2013-02-12 DIAGNOSIS — L218 Other seborrheic dermatitis: Secondary | ICD-10-CM

## 2013-02-12 DIAGNOSIS — F418 Other specified anxiety disorders: Secondary | ICD-10-CM

## 2013-02-12 DIAGNOSIS — L210 Seborrhea capitis: Secondary | ICD-10-CM

## 2013-02-12 LAB — BASIC METABOLIC PANEL
Calcium: 9.5 mg/dL (ref 8.4–10.5)
GFR: 133.49 mL/min (ref >60.00)
Glucose, Bld: 96 mg/dL (ref 70–99)
Potassium: 3.9 mEq/L (ref 3.5–5.1)
Sodium: 137 mEq/L (ref 135–145)

## 2013-02-12 LAB — CBC WITH DIFFERENTIAL/PLATELET
Basophils Absolute: 0 10*3/uL (ref 0.0–0.1)
Eosinophils Relative: 2 % (ref 0.0–5.0)
HCT: 42 % (ref 39.0–52.0)
Hemoglobin: 14.3 g/dL (ref 13.0–17.0)
Lymphocytes Relative: 37.4 % (ref 12.0–46.0)
Lymphs Abs: 2.3 10*3/uL (ref 0.7–4.0)
Monocytes Relative: 8.4 % (ref 3.0–12.0)
Neutro Abs: 3.2 10*3/uL (ref 1.4–7.7)
Platelets: 296 10*3/uL (ref 150.0–400.0)
RDW: 13.8 % (ref 11.5–14.6)
WBC: 6.1 10*3/uL (ref 4.5–10.5)

## 2013-02-12 LAB — URINALYSIS, ROUTINE W REFLEX MICROSCOPIC
Bilirubin Urine: NEGATIVE
Ketones, ur: NEGATIVE
Leukocytes, UA: NEGATIVE
Nitrite: NEGATIVE
Total Protein, Urine: NEGATIVE
Urine Glucose: NEGATIVE
Urobilinogen, UA: 0.2 (ref 0.0–1.0)

## 2013-02-12 LAB — HEPATIC FUNCTION PANEL
ALT: 22 U/L (ref 0–53)
Total Protein: 7.7 g/dL (ref 6.0–8.3)

## 2013-02-12 LAB — LIPID PANEL
Cholesterol: 173 mg/dL (ref 0–200)
HDL: 65.1 mg/dL (ref >39.00)
LDL Cholesterol: 81 mg/dL (ref 0–99)
VLDL: 26.8 mg/dL (ref 0.0–40.0)

## 2013-02-12 LAB — TSH: TSH: 2.54 u[IU]/mL (ref 0.35–5.50)

## 2013-02-12 MED ORDER — PAROXETINE HCL 20 MG PO TABS: 20.0000 mg | ORAL_TABLET | Freq: Every day | ORAL | Status: DC

## 2013-02-12 MED ORDER — KETOCONAZOLE 2 % EX SHAM: MEDICATED_SHAMPOO | CUTANEOUS | Status: DC

## 2013-02-12 MED ORDER — KETOCONAZOLE 200 MG PO TABS: 200.0000 mg | ORAL_TABLET | Freq: Every day | ORAL | Status: DC

## 2013-02-12 NOTE — Progress Notes (Signed)
Pre visit review using our clinic review tool, if applicable. No additional management support is needed unless otherwise documented below in the visit note. 

## 2013-02-12 NOTE — Assessment & Plan Note (Signed)
Worse Discussed Declined nutr ref

## 2013-02-12 NOTE — Progress Notes (Signed)
  Subjective:   HPI  The patient is here for a wellness exam. The patient has been doing well overall without major physical or psychological issues going on lately. F/u on premat ejaculation, anxiety, mild insomnia, dandruff. Doing better  BP Readings from Last 3 Encounters:  02/12/13 118/80  08/17/12 120/90  02/15/12 110/70   Wt Readings from Last 3 Encounters:  02/12/13 325 lb (147.419 kg)  08/17/12 311 lb (141.069 kg)  02/15/12 292 lb (132.45 kg)     Review of Systems  Constitutional: Negative for appetite change, fatigue and unexpected weight change.  HENT: Negative for congestion, nosebleeds, sneezing, sore throat and trouble swallowing.   Eyes: Negative for itching and visual disturbance.  Respiratory: Negative for cough.   Cardiovascular: Negative for chest pain, palpitations and leg swelling.  Gastrointestinal: Negative for nausea, diarrhea, blood in stool and abdominal distention.  Genitourinary: Negative for frequency and hematuria.  Musculoskeletal: Negative for back pain, gait problem, joint swelling and neck pain.  Skin: Negative for rash.  Neurological: Negative for dizziness, tremors, speech difficulty and weakness.  Psychiatric/Behavioral: Positive for sleep disturbance. Negative for suicidal ideas, confusion, dysphoric mood, decreased concentration and agitation. The patient is nervous/anxious.        Objective:   Physical Exam  Constitutional: He is oriented to person, place, and time. He appears well-developed.  Obese   HENT:  Mouth/Throat: Oropharynx is clear and moist.  Eyes: Conjunctivae are normal. Pupils are equal, round, and reactive to light.  Neck: Normal range of motion. No JVD present. No thyromegaly present.  Cardiovascular: Normal rate, regular rhythm, normal heart sounds and intact distal pulses.  Exam reveals no gallop and no friction rub.   No murmur heard. Pulmonary/Chest: Effort normal and breath sounds normal. No respiratory distress.  He has no wheezes. He has no rales. He exhibits no tenderness.  Abdominal: Soft. Bowel sounds are normal. He exhibits no distension and no mass. There is no tenderness. There is no rebound and no guarding.  Genitourinary:  Testes nl B - self-exam  Musculoskeletal: Normal range of motion. He exhibits no edema and no tenderness.  Lymphadenopathy:    He has no cervical adenopathy.  Neurological: He is alert and oriented to person, place, and time. He has normal reflexes. No cranial nerve deficit. He exhibits normal muscle tone. Coordination normal.  Skin: Skin is warm and dry. No rash noted.  Nl scalp  Psychiatric: He has a normal mood and affect. His behavior is normal. Judgment and thought content normal.   Lab Results  Component Value Date   WBC 5.1 11/07/2011   HGB 13.7 11/07/2011   HCT 41.9 11/07/2011   PLT 280.0 11/07/2011   GLUCOSE 89 11/07/2011   CHOL 135 11/07/2011   TRIG 23.0 11/07/2011   HDL 59.10 11/07/2011   LDLCALC 71 11/07/2011   ALT 23 11/07/2011   AST 23 11/07/2011   NA 141 11/07/2011   K 4.0 11/07/2011   CL 108 11/07/2011   CREATININE 0.8 11/07/2011   BUN 12 11/07/2011   CO2 25 11/07/2011   TSH 1.48 11/07/2011          Assessment & Plan:

## 2013-02-12 NOTE — Patient Instructions (Signed)
Wt Readings from Last 3 Encounters:  02/12/13 325 lb (147.419 kg)  08/17/12 311 lb (141.069 kg)  02/15/12 292 lb (132.45 kg)

## 2013-02-12 NOTE — Assessment & Plan Note (Addendum)
Continue with current shampoo prescription therapy as reflected on the Med list. Ketocon po x 2 wks

## 2013-02-12 NOTE — Assessment & Plan Note (Signed)
We discussed age appropriate health related issues, including available/recomended screening tests and vaccinations. We discussed a need for adhering to healthy diet and exercise. Labs/EKG were reviewed/ordered. All questions were answered.   

## 2013-02-12 NOTE — Assessment & Plan Note (Signed)
Will increase Paroxetene

## 2013-04-14 ENCOUNTER — Other Ambulatory Visit: Payer: Self-pay | Admitting: Internal Medicine

## 2013-08-14 ENCOUNTER — Ambulatory Visit (INDEPENDENT_AMBULATORY_CARE_PROVIDER_SITE_OTHER): Payer: 59 | Admitting: Internal Medicine

## 2013-08-14 ENCOUNTER — Encounter: Payer: Self-pay | Admitting: Internal Medicine

## 2013-08-14 VITALS — BP 112/80 | HR 70 | Temp 97.6°F | Ht 70.75 in | Wt 329.0 lb

## 2013-08-14 DIAGNOSIS — E559 Vitamin D deficiency, unspecified: Secondary | ICD-10-CM

## 2013-08-14 DIAGNOSIS — F418 Other specified anxiety disorders: Secondary | ICD-10-CM

## 2013-08-14 DIAGNOSIS — L218 Other seborrheic dermatitis: Secondary | ICD-10-CM

## 2013-08-14 DIAGNOSIS — E669 Obesity, unspecified: Secondary | ICD-10-CM

## 2013-08-14 DIAGNOSIS — F411 Generalized anxiety disorder: Secondary | ICD-10-CM

## 2013-08-14 DIAGNOSIS — L210 Seborrhea capitis: Secondary | ICD-10-CM

## 2013-08-14 MED ORDER — PAROXETINE HCL 40 MG PO TABS: 40.0000 mg | ORAL_TABLET | Freq: Every day | ORAL | Status: DC

## 2013-08-14 MED ORDER — KETOCONAZOLE 2 % EX SHAM: MEDICATED_SHAMPOO | CUTANEOUS | Status: DC

## 2013-08-14 MED ORDER — KETOCONAZOLE 200 MG PO TABS: 200.0000 mg | ORAL_TABLET | Freq: Every day | ORAL | Status: DC

## 2013-08-14 NOTE — Assessment & Plan Note (Addendum)
Continue with current prescription therapy as reflected on the Med list - will increase Paroxetine dose.

## 2013-08-14 NOTE — Assessment & Plan Note (Signed)
Wt Readings from Last 3 Encounters:  08/14/13 329 lb (149.233 kg)  02/12/13 325 lb (147.419 kg)  08/17/12 311 lb (141.069 kg)  discussed wt loss

## 2013-08-14 NOTE — Progress Notes (Signed)
Patient ID: Tommy [name], male   DOB: [birth year], 28 y.o.   MRN: [id number]  Subjective:   HPI  The patient is here for a wellness exam. The patient has been doing well overall without major physical or psychological issues going on lately. F/u on premat ejaculation, anxiety, mild insomnia, dandruff. Doing better  BP Readings from Last 3 Encounters:  08/14/13 112/80  02/12/13 118/80  08/17/12 120/90   Wt Readings from Last 3 Encounters:  08/14/13 329 lb (149.233 kg)  02/12/13 325 lb (147.419 kg)  08/17/12 311 lb (141.069 kg)     Review of Systems  Constitutional: Negative for appetite change, fatigue and unexpected weight change.  HENT: Negative for congestion, nosebleeds, sneezing, sore throat and trouble swallowing.   Eyes: Negative for itching and visual disturbance.  Respiratory: Negative for cough.   Cardiovascular: Negative for chest pain, palpitations and leg swelling.  Gastrointestinal: Negative for nausea, diarrhea, blood in stool and abdominal distention.  Genitourinary: Negative for frequency and hematuria.  Musculoskeletal: Negative for back pain, gait problem, joint swelling and neck pain.  Skin: Negative for rash.  Neurological: Negative for dizziness, tremors, speech difficulty and weakness.  Psychiatric/Behavioral: Positive for sleep disturbance. Negative for suicidal ideas, confusion, dysphoric mood, decreased concentration and agitation. The patient is nervous/anxious.        Objective:   Physical Exam  Constitutional: He is oriented to person, place, and time. He appears well-developed.  Obese   HENT:  Mouth/Throat: Oropharynx is clear and moist.  Eyes: Conjunctivae are normal. Pupils are equal, round, and reactive to light.  Neck: Normal range of motion. No JVD present. No thyromegaly present.  Cardiovascular: Normal rate, regular rhythm, normal heart sounds and intact distal pulses.  Exam reveals no gallop and no friction rub.   No murmur  heard. Pulmonary/Chest: Effort normal and breath sounds normal. No respiratory distress. He has no wheezes. He has no rales. He exhibits no tenderness.  Abdominal: Soft. Bowel sounds are normal. He exhibits no distension and no mass. There is no tenderness. There is no rebound and no guarding.  Genitourinary:  Testes nl B - self-exam  Musculoskeletal: Normal range of motion. He exhibits no edema and no tenderness.  Lymphadenopathy:    He has no cervical adenopathy.  Neurological: He is alert and oriented to person, place, and time. He has normal reflexes. No cranial nerve deficit. He exhibits normal muscle tone. Coordination normal.  Skin: Skin is warm and dry. No rash noted.  Nl scalp  Psychiatric: He has a normal mood and affect. His behavior is normal. Judgment and thought content normal.   Lab Results  Component Value Date   WBC 6.1 02/12/2013   HGB 14.3 02/12/2013   HCT 42.0 02/12/2013   PLT 296.0 02/12/2013   GLUCOSE 96 02/12/2013   CHOL 173 02/12/2013   TRIG 134.0 02/12/2013   HDL 65.10 02/12/2013   LDLCALC 81 02/12/2013   ALT 22 02/12/2013   AST 20 02/12/2013   NA 137 02/12/2013   K 3.9 02/12/2013   CL 105 02/12/2013   CREATININE 0.9 02/12/2013   BUN 18 02/12/2013   CO2 27 02/12/2013   TSH 2.54 02/12/2013          Assessment & Plan:

## 2013-08-14 NOTE — Progress Notes (Signed)
Pre visit review using our clinic review tool, if applicable. No additional management support is needed unless otherwise documented below in the visit note. 

## 2013-08-14 NOTE — Assessment & Plan Note (Signed)
Continue with current prescription therapy as reflected on the Med list.  

## 2013-09-16 ENCOUNTER — Ambulatory Visit (INDEPENDENT_AMBULATORY_CARE_PROVIDER_SITE_OTHER): Payer: 59 | Admitting: Internal Medicine

## 2013-09-16 ENCOUNTER — Encounter: Payer: Self-pay | Admitting: Internal Medicine

## 2013-09-16 VITALS — BP 112/72 | HR 64 | Temp 98.5°F | Resp 16 | Wt 333.0 lb

## 2013-09-16 DIAGNOSIS — M25569 Pain in unspecified knee: Secondary | ICD-10-CM

## 2013-09-16 DIAGNOSIS — M25562 Pain in left knee: Secondary | ICD-10-CM | POA: Insufficient documentation

## 2013-09-16 MED ORDER — NAPROXEN 500 MG PO TABS: 500.0000 mg | ORAL_TABLET | Freq: Two times a day (BID) | ORAL | Status: DC | PRN

## 2013-09-16 NOTE — Patient Instructions (Signed)
Wt Readings from Last 3 Encounters:  09/16/13 333 lb (151.048 kg)  08/14/13 329 lb (149.233 kg)  02/12/13 325 lb (147.419 kg)

## 2013-09-16 NOTE — Assessment & Plan Note (Addendum)
7/15 -- patellofemoral syndrome Naproxen prn  Info/exercises given Dr Katrinka BlazingSmith cons if not better

## 2013-09-16 NOTE — Progress Notes (Signed)
Pre visit review using our clinic review tool, if applicable. No additional management support is needed unless otherwise documented below in the visit note. 

## 2013-09-16 NOTE — Progress Notes (Signed)
   Subjective:   HPI  C/o L knee pain over patella - tightness after playing a lot of basketball (2 times a day, stopped 2 wks ago due to pain) x 2 wks; no injury  F/u on premat ejaculation, anxiety, mild insomnia, dandruff. Doing better  BP Readings from Last 3 Encounters:  09/16/13 112/72  08/14/13 112/80  02/12/13 118/80   Wt Readings from Last 3 Encounters:  09/16/13 333 lb (151.048 kg)  08/14/13 329 lb (149.233 kg)  02/12/13 325 lb (147.419 kg)     Review of Systems  Constitutional: Negative for appetite change, fatigue and unexpected weight change.  HENT: Negative for congestion, nosebleeds, sneezing, sore throat and trouble swallowing.   Eyes: Negative for itching and visual disturbance.  Respiratory: Negative for cough.   Cardiovascular: Negative for chest pain, palpitations and leg swelling.  Gastrointestinal: Negative for nausea, diarrhea, blood in stool and abdominal distention.  Genitourinary: Negative for frequency and hematuria.  Musculoskeletal: Negative for back pain, gait problem, joint swelling and neck pain.  Skin: Negative for rash.  Neurological: Negative for dizziness, tremors, speech difficulty and weakness.  Psychiatric/Behavioral: Positive for sleep disturbance. Negative for suicidal ideas, confusion, dysphoric mood, decreased concentration and agitation. The patient is nervous/anxious.        Objective:   Physical Exam  Constitutional: He is oriented to person, place, and time. He appears well-developed.  Obese   HENT:  Mouth/Throat: Oropharynx is clear and moist.  Eyes: Conjunctivae are normal. Pupils are equal, round, and reactive to light.  Neck: Normal range of motion. No JVD present. No thyromegaly present.  Cardiovascular: Normal rate, regular rhythm, normal heart sounds and intact distal pulses.  Exam reveals no gallop and no friction rub.   No murmur heard. Pulmonary/Chest: Effort normal and breath sounds normal. No respiratory distress.  He has no wheezes. He has no rales. He exhibits no tenderness.  Abdominal: Soft. Bowel sounds are normal. He exhibits no distension and no mass. There is no tenderness. There is no rebound and no guarding.  Genitourinary:  Testes nl B - self-exam  Musculoskeletal: Normal range of motion. He exhibits no edema and no tenderness.  Lymphadenopathy:    He has no cervical adenopathy.  Neurological: He is alert and oriented to person, place, and time. He has normal reflexes. No cranial nerve deficit. He exhibits normal muscle tone. Coordination normal.  Skin: Skin is warm and dry. No rash noted.  Nl scalp  Psychiatric: He has a normal mood and affect. His behavior is normal. Judgment and thought content normal.  L knee is tender over patella, a little painful knee extension Lab Results  Component Value Date   WBC 6.1 02/12/2013   HGB 14.3 02/12/2013   HCT 42.0 02/12/2013   PLT 296.0 02/12/2013   GLUCOSE 96 02/12/2013   CHOL 173 02/12/2013   TRIG 134.0 02/12/2013   HDL 65.10 02/12/2013   LDLCALC 81 02/12/2013   ALT 22 02/12/2013   AST 20 02/12/2013   NA 137 02/12/2013   K 3.9 02/12/2013   CL 105 02/12/2013   CREATININE 0.9 02/12/2013   BUN 18 02/12/2013   CO2 27 02/12/2013   TSH 2.54 02/12/2013          Assessment & Plan:

## 2014-01-28 ENCOUNTER — Inpatient Hospital Stay (HOSPITAL_COMMUNITY)
Admission: AD | Admit: 2014-01-28 | Discharge: 2014-01-31 | DRG: 885 | Disposition: A | Discharge status: Home / Self Care | Payer: 59 | Source: Intra-hospital | Attending: Psychiatry | Admitting: Psychiatry

## 2014-01-28 ENCOUNTER — Encounter (HOSPITAL_COMMUNITY): Payer: Self-pay | Admitting: Emergency Medicine

## 2014-01-28 ENCOUNTER — Encounter (HOSPITAL_COMMUNITY): Payer: Self-pay

## 2014-01-28 ENCOUNTER — Emergency Department (HOSPITAL_COMMUNITY)
Admission: EM | Admit: 2014-01-28 | Discharge: 2014-01-28 | Disposition: A | Discharge status: Other Acute Inpatient Hospital | Payer: 59 | Attending: Emergency Medicine | Admitting: Emergency Medicine

## 2014-01-28 DIAGNOSIS — Z87891 Personal history of nicotine dependence: Secondary | ICD-10-CM | POA: Diagnosis not present

## 2014-01-28 DIAGNOSIS — F4321 Adjustment disorder with depressed mood: Secondary | ICD-10-CM | POA: Diagnosis present

## 2014-01-28 DIAGNOSIS — R45851 Suicidal ideations: Secondary | ICD-10-CM | POA: Insufficient documentation

## 2014-01-28 DIAGNOSIS — Z8709 Personal history of other diseases of the respiratory system: Secondary | ICD-10-CM | POA: Insufficient documentation

## 2014-01-28 DIAGNOSIS — Z79899 Other long term (current) drug therapy: Secondary | ICD-10-CM | POA: Insufficient documentation

## 2014-01-28 DIAGNOSIS — F419 Anxiety disorder, unspecified: Secondary | ICD-10-CM | POA: Diagnosis present

## 2014-01-28 DIAGNOSIS — Z599 Problem related to housing and economic circumstances, unspecified: Secondary | ICD-10-CM

## 2014-01-28 DIAGNOSIS — G4700 Insomnia, unspecified: Secondary | ICD-10-CM | POA: Diagnosis present

## 2014-01-28 DIAGNOSIS — F332 Major depressive disorder, recurrent severe without psychotic features: Secondary | ICD-10-CM | POA: Diagnosis present

## 2014-01-28 DIAGNOSIS — F321 Major depressive disorder, single episode, moderate: Secondary | ICD-10-CM | POA: Diagnosis present

## 2014-01-28 DIAGNOSIS — F339 Major depressive disorder, recurrent, unspecified: Secondary | ICD-10-CM | POA: Insufficient documentation

## 2014-01-28 LAB — CBC
HCT: 42.7 % (ref 39.0–52.0)
HEMOGLOBIN: 14 g/dL (ref 13.0–17.0)
MCH: 29.4 pg (ref 26.0–34.0)
MCHC: 32.8 g/dL (ref 30.0–36.0)
MCV: 89.7 fL (ref 78.0–100.0)
Platelets: 325 10*3/uL (ref 150–400)
RBC: 4.76 MIL/uL (ref 4.22–5.81)
RDW: 12.9 % (ref 11.5–15.5)
WBC: 6.3 10*3/uL (ref 4.0–10.5)

## 2014-01-28 LAB — COMPREHENSIVE METABOLIC PANEL
ALK PHOS: 50 U/L (ref 39–117)
ALT: 8 U/L (ref 0–53)
ANION GAP: 16 — AB (ref 5–15)
AST: 17 U/L (ref 0–37)
Albumin: 4.5 g/dL (ref 3.5–5.2)
BILIRUBIN TOTAL: 0.6 mg/dL (ref 0.3–1.2)
BUN: 11 mg/dL (ref 6–23)
CHLORIDE: 106 meq/L (ref 96–112)
CO2: 24 meq/L (ref 19–32)
Calcium: 10.1 mg/dL (ref 8.4–10.5)
Creatinine, Ser: 0.84 mg/dL (ref 0.50–1.35)
GFR calc Af Amer: >90 mL/min (ref >90)
Glucose, Bld: 100 mg/dL — ABNORMAL HIGH (ref 70–99)
Potassium: 4 mEq/L (ref 3.7–5.3)
Sodium: 146 mEq/L (ref 137–147)
Total Protein: 8.2 g/dL (ref 6.0–8.3)

## 2014-01-28 LAB — RAPID URINE DRUG SCREEN, HOSP PERFORMED
Amphetamines: NOT DETECTED
BARBITURATES: NOT DETECTED
Benzodiazepines: NOT DETECTED
COCAINE: NOT DETECTED
Opiates: NOT DETECTED
TETRAHYDROCANNABINOL: NOT DETECTED

## 2014-01-28 LAB — ACETAMINOPHEN LEVEL: Acetaminophen (Tylenol), Serum: <15 ug/mL (ref 10–30)

## 2014-01-28 LAB — SALICYLATE LEVEL: Salicylate Lvl: <2 mg/dL — ABNORMAL LOW (ref 2.8–20.0)

## 2014-01-28 LAB — ETHANOL

## 2014-01-28 MED ORDER — PAROXETINE HCL 20 MG PO TABS
40.0000 mg | ORAL_TABLET | Freq: Every day | ORAL | Status: DC
  Administered 2014-01-29 – 2014-01-30 (×2): 40 mg via ORAL
  Filled 2014-01-28 (×4): qty 2

## 2014-01-28 MED ORDER — LORAZEPAM 1 MG PO TABS: 1.0000 mg | ORAL_TABLET | Freq: Three times a day (TID) | ORAL | Status: DC | PRN

## 2014-01-28 MED ORDER — ZOLPIDEM TARTRATE 10 MG PO TABS: 10.0000 mg | ORAL_TABLET | Freq: Every evening | ORAL | Status: DC | PRN

## 2014-01-28 MED ORDER — HYDROXYZINE HCL 25 MG PO TABS
25.0000 mg | ORAL_TABLET | Freq: Four times a day (QID) | ORAL | Status: DC | PRN
  Filled 2014-01-28: qty 10

## 2014-01-28 MED ORDER — NAPROXEN 500 MG PO TABS: 500.0000 mg | ORAL_TABLET | Freq: Two times a day (BID) | ORAL | Status: DC | PRN

## 2014-01-28 MED ORDER — MAGNESIUM HYDROXIDE 400 MG/5ML PO SUSP: 30.0000 mL | Freq: Every day | ORAL | Status: DC | PRN

## 2014-01-28 MED ORDER — PAROXETINE HCL 20 MG PO TABS
40.0000 mg | ORAL_TABLET | Freq: Every day | ORAL | Status: DC
Start: 2014-01-28 — End: 2014-01-28
  Administered 2014-01-28: 40 mg via ORAL
  Filled 2014-01-28: qty 2

## 2014-01-28 MED ORDER — ONDANSETRON HCL 4 MG PO TABS: 4.0000 mg | ORAL_TABLET | Freq: Three times a day (TID) | ORAL | Status: DC | PRN

## 2014-01-28 MED ORDER — ACETAMINOPHEN 325 MG PO TABS: 650.0000 mg | ORAL_TABLET | Freq: Four times a day (QID) | ORAL | Status: DC | PRN

## 2014-01-28 MED ORDER — ALUM & MAG HYDROXIDE-SIMETH 200-200-20 MG/5ML PO SUSP: 30.0000 mL | ORAL | Status: DC | PRN

## 2014-01-28 MED ORDER — TRAZODONE HCL 50 MG PO TABS
50.0000 mg | ORAL_TABLET | Freq: Every evening | ORAL | Status: DC | PRN
  Filled 2014-01-28 (×8): qty 1

## 2014-01-28 MED ORDER — ACETAMINOPHEN 325 MG PO TABS: 650.0000 mg | ORAL_TABLET | ORAL | Status: DC | PRN

## 2014-01-28 NOTE — ED Notes (Signed)
Pt's mother in to visit  Wanded by security prior to going in room

## 2014-01-28 NOTE — BH Assessment (Signed)
[facility] Assessment Progress Note  [facility] per Thurman Coyer[facility] Kaplan, [facility].  Updated EDP Romeo AppleHarrison and ED staff. [facility] as pt is under [facility]C.   Casimer LaniusKristen Lavona Norsworthy, MS, [facility] Licensed Professional Counselor Therapeutic Triage Specialist [facility] Phone: [phone] Fax: [phone]

## 2014-01-28 NOTE — Consult Note (Signed)
[facility] Face-to-Face Psychiatry Consult   Reason for Consult:  Worsening depression and suicidal ideation Referring Physician:  EDP  Tommy [name] is an 28 y.o. male. Total Time spent with patient: 45 minutes  Assessment: AXIS I:  Major Depression, Recurrent severe AXIS II:  Deferred AXIS III:   Past Medical History  Diagnosis Date  . ALLERGIC RHINITIS 08/25/2009    Qualifier: Diagnosis of  By: Plotnikov MD, Evie Lacks   . HAND PAIN 08/25/2009    Qualifier: Diagnosis of  By: Plotnikov MD, Evie Lacks   . Anxiety    AXIS IV:  other psychosocial or environmental problems and problems related to social environment AXIS V:  11-20 some danger of hurting self or others possible OR occasionally fails to maintain minimal personal hygiene OR gross impairment in communication  Plan:  Recommend psychiatric Inpatient admission when medically cleared.  Subjective:   Tommy [name] is a 28 y.o. male patient presented to [facility] under IVC with complaints of wanting to kill himself by cuttting himself with a knife.  HPI:  Patient states that he has worsening depression.  "I'm here cause of attempted suicide.  I was going to cut my wrist with a knife.  I just have so much stress.  I'm having to pay all of the bills; well not all but most.  I can't see my girlfriend as much as I want.  I just have a lot of mixed emotions."  Patient states that he is living with his mother and working two jobs.  Patient continues to endorse suicidal ideation and unable to contract for safety.  Patient denies homicidal ideation, psychosis, and paranoia.  Patient states that this is the first time that he has had the thoughts of wanting to kill himself but has had issues with depression since high school.  Past medication was Zoloft unable to say if worked for him since so long ago.    HPI Elements:   Location:  Worsening depression. Quality:  suicidal ideation. Severity:  plan to cut himself. Timing:  1 week. Review of Systems   Psychiatric/Behavioral: Positive for depression and suicidal ideas. Negative for hallucinations, memory loss and substance abuse. The patient is not nervous/anxious and does not have insomnia.   All other systems reviewed and are negative.  Family History  Problem Relation Age of Onset  . Arthritis Mother     Past Psychiatric History: Past Medical History  Diagnosis Date  . ALLERGIC RHINITIS 08/25/2009    Qualifier: Diagnosis of  By: Plotnikov MD, Evie Lacks   . HAND PAIN 08/25/2009    Qualifier: Diagnosis of  By: Plotnikov MD, Evie Lacks   . Anxiety     reports that he has quit smoking. He does not have any smokeless tobacco history on file. He reports that he drinks alcohol. He reports that he does not use illicit drugs. Family History  Problem Relation Age of Onset  . Arthritis Mother    Family History Substance Abuse: No Family Supports: Yes, List: (mother) Living Arrangements: Parent Can pt return to current living arrangement?: Yes Abuse/Neglect [facility]) Physical Abuse: Denies Verbal Abuse: Denies Sexual Abuse: Denies Allergies:  No Known Allergies  ACT Assessment Complete:  Yes:    Educational Status    Risk to Self: Risk to self with the past 6 months Suicidal Ideation: Yes-Currently Present Suicidal Intent: Yes-Currently Present Is patient at risk for suicide?: Yes Suicidal Plan?: Yes-Currently Present Specify Current Suicidal Plan: Pt had a knife stating he was  going to cut his wrists Access to Means: Yes Specify Access to Suicidal Means: pt had access to a knife What has been your use of drugs/alcohol within the last 12 months?: occ alcohol use reported Previous Attempts/Gestures: No How many times?: 0 Other Self Harm Risks: na - pt denies Triggers for Past Attempts: None known Intentional Self Injurious Behavior: None Family Suicide History: No Recent stressful life event(s): Conflict (Comment), Financial Problems (conflict with girlfriend,  financial) Persecutory voices/beliefs?: No Depression: Yes Depression Symptoms: Despondent, Loss of interest in usual pleasures, Feeling worthless/self pity, Feeling angry/irritable Substance abuse history and/or treatment for substance abuse?: No Suicide prevention information given to non-admitted patients: Not applicable  Risk to Others: Risk to Others within the past 6 months Homicidal Ideation: No Thoughts of Harm to Others: No Current Homicidal Intent: No Current Homicidal Plan: No Access to Homicidal Means: No Identified Victim: na - pt denies History of harm to others?: No Assessment of Violence: None Noted Violent Behavior Description: na - pt calm, cooperative Does patient have access to weapons?: No Criminal Charges Pending?: No Does patient have a court date: No  Abuse: Abuse/Neglect Assessment (Assessment to be complete while patient is alone) Physical Abuse: Denies Verbal Abuse: Denies Sexual Abuse: Denies Exploitation of patient/patient's resources: Denies Self-Neglect: Denies  Prior Inpatient Therapy: Prior Inpatient Therapy Prior Inpatient Therapy: No Prior Therapy Dates: na Prior Therapy Facilty/Provider(s): na Reason for Treatment: na  Prior Outpatient Therapy: Prior Outpatient Therapy Prior Outpatient Therapy: Yes Prior Therapy Dates: In high school and 2007 Prior Therapy Facilty/Provider(s): unknown providers Reason for Treatment: Relationship problems  Additional Information: Additional Information 1:1 In Past 12 Months?: No CIRT Risk: No Elopement Risk: No Does patient have medical clearance?: Yes                  Objective: Blood pressure 160/77, pulse 74, temperature 98.1 F (36.7 C), temperature source Oral, resp. rate 18, height 5' 11"  (1.803 m), weight 142.883 kg (315 lb), SpO2 100 %.Body mass index is 43.95 kg/(m^2). Results for orders placed or performed during the hospital encounter of 01/28/14 (from the past 72 hour(s))   Acetaminophen level     Status: None   Collection Time: 01/28/14  5:35 AM  Result Value Ref Range   Acetaminophen (Tylenol), Serum <15.0 10 - 30 ug/mL    Comment:        THERAPEUTIC CONCENTRATIONS VARY SIGNIFICANTLY. A RANGE OF 10-30 ug/mL MAY BE AN EFFECTIVE CONCENTRATION FOR MANY PATIENTS. HOWEVER, SOME ARE BEST TREATED AT CONCENTRATIONS OUTSIDE THIS RANGE. ACETAMINOPHEN CONCENTRATIONS >150 ug/mL AT 4 HOURS AFTER INGESTION AND >50 ug/mL AT 12 HOURS AFTER INGESTION ARE OFTEN ASSOCIATED WITH TOXIC REACTIONS.   CBC     Status: None   Collection Time: 01/28/14  5:35 AM  Result Value Ref Range   WBC 6.3 4.0 - 10.5 K/uL   RBC 4.76 4.22 - 5.81 MIL/uL   Hemoglobin 14.0 13.0 - 17.0 g/dL   HCT 42.7 39.0 - 52.0 %   MCV 89.7 78.0 - 100.0 fL   MCH 29.4 26.0 - 34.0 pg   MCHC 32.8 30.0 - 36.0 g/dL   RDW 12.9 11.5 - 15.5 %   Platelets 325 150 - 400 K/uL  Comprehensive metabolic panel     Status: Abnormal   Collection Time: 01/28/14  5:35 AM  Result Value Ref Range   Sodium 146 137 - 147 mEq/L   Potassium 4.0 3.7 - 5.3 mEq/L   Chloride 106 96 - 112 mEq/L  CO2 24 19 - 32 mEq/L   Glucose, Bld 100 (H) 70 - 99 mg/dL   BUN 11 6 - 23 mg/dL   Creatinine, Ser 0.84 0.50 - 1.35 mg/dL   Calcium 10.1 8.4 - 10.5 mg/dL   Total Protein 8.2 6.0 - 8.3 g/dL   Albumin 4.5 3.5 - 5.2 g/dL   AST 17 0 - 37 U/L   ALT 8 0 - 53 U/L   Alkaline Phosphatase 50 39 - 117 U/L   Total Bilirubin 0.6 0.3 - 1.2 mg/dL   GFR calc non Af Amer >90 >90 mL/min   GFR calc Af Amer >90 >90 mL/min    Comment: (NOTE) The eGFR has been calculated using the CKD EPI equation. This calculation has not been validated in all clinical situations. eGFR's persistently <90 mL/min signify possible Chronic Kidney Disease.    Anion gap 16 (H) 5 - 15  Ethanol (ETOH)     Status: None   Collection Time: 01/28/14  5:35 AM  Result Value Ref Range   Alcohol, Ethyl (B) <11 0 - 11 mg/dL    Comment:        LOWEST DETECTABLE LIMIT  FOR SERUM ALCOHOL IS 11 mg/dL FOR MEDICAL PURPOSES ONLY   Salicylate level     Status: Abnormal   Collection Time: 01/28/14  5:35 AM  Result Value Ref Range   Salicylate Lvl <6.2 (L) 2.8 - 20.0 mg/dL  Urine Drug Screen     Status: None   Collection Time: 01/28/14  5:37 AM  Result Value Ref Range   Opiates NONE DETECTED NONE DETECTED   Cocaine NONE DETECTED NONE DETECTED   Benzodiazepines NONE DETECTED NONE DETECTED   Amphetamines NONE DETECTED NONE DETECTED   Tetrahydrocannabinol NONE DETECTED NONE DETECTED   Barbiturates NONE DETECTED NONE DETECTED    Comment:        DRUG SCREEN FOR MEDICAL PURPOSES ONLY.  IF CONFIRMATION IS NEEDED FOR ANY PURPOSE, NOTIFY LAB WITHIN 5 DAYS.        LOWEST DETECTABLE LIMITS FOR URINE DRUG SCREEN Drug Class       Cutoff (ng/mL) Amphetamine      1000 Barbiturate      200 Benzodiazepine   263 Tricyclics       335 Opiates          300 Cocaine          300 THC              50    Labs are reviewed see values above.  Medications reviewed and home medication Paxil 40 mg daily restarted.  Current Facility-Administered Medications  Medication Dose Route Frequency Provider Last Rate Last Dose  . acetaminophen (TYLENOL) tablet 650 mg  650 mg Oral Q4H PRN Shari A Upstill, PA-C      . LORazepam (ATIVAN) tablet 1 mg  1 mg Oral Q8H PRN Shari A Upstill, PA-C      . ondansetron (ZOFRAN) tablet 4 mg  4 mg Oral Q8H PRN Shari A Upstill, PA-C      . zolpidem (AMBIEN) tablet 10 mg  10 mg Oral QHS PRN Dewaine Oats, PA-C       Current Outpatient Prescriptions  Medication Sig Dispense Refill  . ketoconazole (NIZORAL) 2 % shampoo Apply topically 2 (two) times a week. As directed 360 mL 3  . ketoconazole (NIZORAL) 200 MG tablet Take 1 tablet (200 mg total) by mouth daily. 14 tablet 0  . naproxen (NAPROSYN) 500 MG tablet  Take 1 tablet (500 mg total) by mouth 2 (two) times daily as needed for moderate pain (pc). 60 tablet 2  . PARoxetine (PAXIL) 40 MG tablet  Take 1 tablet (40 mg total) by mouth daily. 30 tablet 5    Psychiatric Specialty Exam:     Blood pressure 160/77, pulse 74, temperature 98.1 F (36.7 C), temperature source Oral, resp. rate 18, height 5' 11"  (1.803 m), weight 142.883 kg (315 lb), SpO2 100 %.Body mass index is 43.95 kg/(m^2).  General Appearance: Casual  Eye Contact::  Good  Speech:  Clear and Coherent and Normal Rate  Volume:  Normal  Mood:  Depressed  Affect:  Congruent  Thought Process:  Circumstantial  Orientation:  Full (Time, Place, and Person)  Thought Content:  "I just want to get away from all of the problems"  Suicidal Thoughts:  Yes.  with intent/plan  Homicidal Thoughts:  No  Memory:  Immediate;   Good Recent;   Good Remote;   Good  Judgement:  Poor  Insight:  Lacking and Shallow  Psychomotor Activity:  Normal  Concentration:  Fair  Recall:  [place] of Knowledge:Good  Language: Good  Akathisia:  No  Handed:  Right  AIMS (if indicated):     Assets:  Communication Skills Desire for Improvement Housing Social Support Transportation  Sleep:      Musculoskeletal: Strength & Muscle Tone: within normal limits Gait & Station: normal Patient leans: N/A  Treatment Plan Summary: Daily contact with patient to assess and evaluate symptoms and progress in treatment Medication management Inpatient treatment for stabilization  Earleen Newport, FNP-BC 01/28/2014 10:31 AM  Patient seen, evaluated and I agree with notes by Nurse Practitioner. Corena Pilgrim, MD

## 2014-01-28 NOTE — ED Provider Notes (Signed)
CSN: [id number]     Arrival date & time 01/28/14  [id number] History   First MD Initiated Contact with Patient 01/28/14 [phone]     Chief Complaint  Patient presents with  . Suicidal     (Consider location/radiation/quality/duration/timing/severity/associated sxs/prior Treatment) Patient is a 28 y.o. male presenting with mental health disorder. The history is provided by the patient. No language interpreter was used.  Mental Health Problem Presenting symptoms: suicidal thoughts and suicidal threats   Presenting symptoms: no self mutilation   Patient accompanied by:  Law enforcement Degree of incapacity (severity):  Severe Associated symptoms comment:  Police were called by patient's mother when he made suicidal gestures with a knife. When police arrived, patient was holding a knife to his wrist threatening to cut. IVC paperwork is being started by mom with the magistrate. The patient is brought to ED for medical clearance and psychiatric management. The patient denies any physical complaints, history of suicidal intent or current overdose or other self-harm.   Past Medical History  Diagnosis Date  . ALLERGIC RHINITIS 08/25/2009    Qualifier: Diagnosis of  By: Plotnikov MD, Georgina QuintAleksei V   . HAND PAIN 08/25/2009    Qualifier: Diagnosis of  By: Plotnikov MD, Georgina QuintAleksei V   . Anxiety    History reviewed. No pertinent past surgical history. Family History  Problem Relation Age of Onset  . Arthritis Mother    History  Substance Use Topics  . Smoking status: Former Games developermoker  . Smokeless tobacco: Not on file  . Alcohol Use: Yes     Comment: occ    Review of Systems  Constitutional: Negative for fever and chills.  HENT: Negative.   Respiratory: Negative.   Cardiovascular: Negative.   Gastrointestinal: Negative.   Genitourinary: Negative.   Musculoskeletal: Negative.   Skin: Negative.   Neurological: Negative.   Psychiatric/Behavioral: Positive for suicidal ideas. Negative for self-injury.       Allergies  Review of patient's allergies indicates no known allergies.  Home Medications   Prior to Admission medications   Medication Sig Start Date End Date Taking? Authorizing Provider  ketoconazole (NIZORAL) 2 % shampoo Apply topically 2 (two) times a week. As directed 08/14/13 02/13/14 Yes Aleksei Plotnikov V, MD  ketoconazole (NIZORAL) 200 MG tablet Take 1 tablet (200 mg total) by mouth daily. 08/14/13  Yes Aleksei Plotnikov V, MD  naproxen (NAPROSYN) 500 MG tablet Take 1 tablet (500 mg total) by mouth 2 (two) times daily as needed for moderate pain (pc). 09/16/13  Yes Aleksei Plotnikov V, MD  PARoxetine (PAXIL) 40 MG tablet Take 1 tablet (40 mg total) by mouth daily. 08/14/13  Yes Aleksei Plotnikov V, MD   BP 160/77 mmHg  Pulse 74  Temp(Src) 98.1 F (36.7 C) (Oral)  Resp 18  Ht 5\' 11"  (1.803 m)  Wt 315 lb (142.883 kg)  BMI 43.95 kg/m2  SpO2 100% Physical Exam  Constitutional: He is oriented to person, place, and time. He appears well-developed and well-nourished. No distress.  HENT:  Head: Normocephalic.  Eyes: Conjunctivae are normal.  Neck: Normal range of motion. Neck supple.  Cardiovascular: Normal rate and regular rhythm.   Pulmonary/Chest: Effort normal and breath sounds normal. He has no wheezes. He has no rales.  Abdominal: Soft. Bowel sounds are normal. There is no tenderness. There is no rebound and no guarding.  Musculoskeletal: Normal range of motion.  Neurological: He is alert and oriented to person, place, and time.  Skin: Skin is warm and dry. No  rash noted.  Psychiatric: His speech is delayed. He is withdrawn. He exhibits a depressed mood. He expresses suicidal ideation.    ED Course  Procedures (including critical care time) Labs Review Labs Reviewed  CBC  ACETAMINOPHEN LEVEL  COMPREHENSIVE METABOLIC PANEL  ETHANOL  SALICYLATE LEVEL  URINE RAPID DRUG SCREEN (HOSP PERFORMED)    Imaging Review No results found.   EKG Interpretation None       MDM   Final diagnoses:  None    1. Suicidal ideation/gesture  IVC paperwork being sought by mom. Patient brought by [employer] for suicidal gesture, holding a knife to wrist threatening to cause self-harm. Will need medical clearance and [facility] evaluation.    Arnoldo HookerShari A Geralda Baumgardner, PA-C 01/28/14 [id number]  Loren Raceravid Yelverton, MD 02/01/14 [phone]

## 2014-01-28 NOTE — ED Notes (Signed)
Pt brought in voluntarily at this time  Pt states he is very depressed and was in the kitchen with a knife to his wrist when police arrived  Pt's mother is downtown taking out IVC papers with another officer at this time

## 2014-01-28 NOTE — ED Notes (Signed)
[facility] contacted for patient transport to [facility]. 

## 2014-01-28 NOTE — Progress Notes (Signed)
Pt is a 28 year old male admitted with depression and suicidal gesture   He put a knife to his neck and said he couldn't take it any more   He said his stressors are having to be sole breadwinner for a household and he works 2 jobs and cannot see his girlfriend   Pt admitted that he just had a brief moment of not thinking clearly and realizes he is not really suicidal   He admits to some frustration  He also is worried about a lengthy hospitalization and how it could affect his job   Pt denies drug and ETOH and tobacco use and abuse     Pt was admitted to the adult unit and oriented to the 300 hall    Nourishment was offered and medications were offered   Pt declined medications at present but did take nourishment   Q 15 min checks explained and implemented   Pt is denying suicidal ideation at present  He is logical and coherent and cooperative

## 2014-01-28 NOTE — ED Provider Notes (Signed)
7:07 [facility] Jama Flavorsobos.   Purvis SheffieldForrest Haidynn Almendarez, MD 01/28/14 [phone]

## 2014-01-28 NOTE — BH Assessment (Signed)
Assessment Note  Tommy [name] is an 28 y.o. male that presents to [facility] under IVC by his mother (who took papers out) after pt had a knife and threatened to kill himself by cutting his wrists.  Pt continues to endorse SI, stating he is having conflict with his girlfriend and having financial issues. Pt stated he lives with his mother, is working 2 jobs, trying to take care of his mother and having relationship problems.  Pt denies HI, denies AVH.  No delusions noted.  Pt has depressed mood, stating, "I don't want to be here anymore."  Pt is pleasant, has good eye contact, affect appropriate, had good eye contact, logical/coherent thought processes and normal speech.  The only treatment pt has had is counseling in high school and in 2007 for relationship problems.  Pt stated he was was prescribed Zoloft in high school but wasn't sure why.  Pt seen by Dr. Jannifer Franklin and Assunta Found, NP saw the pt and inpatient treatment recommended. Updated ED and TTS staff.     Axis I: 296.33 Major Depressive Disorder, Recurrent Episode, Severe Axis II: Deferred Axis III:  Past Medical History  Diagnosis Date  . ALLERGIC RHINITIS 08/25/2009    Qualifier: Diagnosis of  By: Plotnikov MD, Georgina Quint   . HAND PAIN 08/25/2009    Qualifier: Diagnosis of  By: Plotnikov MD, Georgina Quint   . Anxiety    Axis IV: economic problems, other psychosocial or environmental problems, problems related to social environment and problems with primary support group Axis V: 21-30 behavior considerably influenced by delusions or hallucinations OR serious impairment in judgment, communication OR inability to function in almost all areas  Past Medical History:  Past Medical History  Diagnosis Date  . ALLERGIC RHINITIS 08/25/2009    Qualifier: Diagnosis of  By: Plotnikov MD, Georgina Quint   . HAND PAIN 08/25/2009    Qualifier: Diagnosis of  By: Plotnikov MD, Georgina Quint   . Anxiety     History reviewed. No pertinent past surgical  history.  Family History:  Family History  Problem Relation Age of Onset  . Arthritis Mother     Social History:  reports that he has quit smoking. He does not have any smokeless tobacco history on file. He reports that he drinks alcohol. He reports that he does not use illicit drugs.  Additional Social History:  Alcohol / Drug Use Pain Medications: none Prescriptions: see med list Over the Counter: see med list History of alcohol / drug use?: Yes (occ alcohol use) Longest period of sobriety (when/how long): na Negative Consequences of Use:  (na) Withdrawal Symptoms:  (na)  CIWA: CIWA-Ar BP: 160/77 mmHg Pulse Rate: 74 COWS:    Allergies: No Known Allergies  Home Medications:  (Not in a hospital admission)  OB/GYN Status:  No LMP for male patient.  General Assessment Data Location of Assessment: [facility] ED Is this a Tele or Face-to-Face Assessment?: Face-to-Face Is this an Initial Assessment or a Re-assessment for this encounter?: Initial Assessment Living Arrangements: Parent Can pt return to current living arrangement?: Yes Admission Status: Voluntary Is patient capable of signing voluntary admission?: Yes Transfer from: Acute Hospital Referral Source: Self/Family/Friend     [facility] Crisis Care Plan Living Arrangements: Parent Name of Psychiatrist: none Name of Therapist: none  Education Status Is patient currently in school?: No Current Grade: na Highest grade of school patient has completed: some college Name of school: [employer] Contact person: self  Risk to self with the past  6 months Suicidal Ideation: Yes-Currently Present Suicidal Intent: Yes-Currently Present Is patient at risk for suicide?: Yes Suicidal Plan?: Yes-Currently Present Specify Current Suicidal Plan: Pt had a knife stating he was going to cut his wrists Access to Means: Yes Specify Access to Suicidal Means: pt had access to a knife What has been your use of drugs/alcohol within the last 12  months?: occ alcohol use reported Previous Attempts/Gestures: No How many times?: 0 Other Self Harm Risks: na - pt denies Triggers for Past Attempts: None known Intentional Self Injurious Behavior: None Family Suicide History: No Recent stressful life event(s): Conflict (Comment), Financial Problems (conflict with girlfriend, financial) Persecutory voices/beliefs?: No Depression: Yes Depression Symptoms: Despondent, Loss of interest in usual pleasures, Feeling worthless/self pity, Feeling angry/irritable Substance abuse history and/or treatment for substance abuse?: No Suicide prevention information given to non-admitted patients: Not applicable  Risk to Others within the past 6 months Homicidal Ideation: No Thoughts of Harm to Others: No Current Homicidal Intent: No Current Homicidal Plan: No Access to Homicidal Means: No Identified Victim: na - pt denies History of harm to others?: No Assessment of Violence: None Noted Violent Behavior Description: na - pt calm, cooperative Does patient have access to weapons?: No Criminal Charges Pending?: No Does patient have a court date: No  Psychosis Hallucinations: None noted Delusions: None noted  Mental Status Report Appear/Hygiene: Disheveled, In scrubs Eye Contact: Good Motor Activity: Freedom of movement, Unremarkable Speech: Logical/coherent Level of Consciousness: Alert Mood: Depressed Affect: Depressed Anxiety Level: Moderate Thought Processes: Coherent, Relevant Judgement: Unimpaired Orientation: Person, Place, Time, Situation Obsessive Compulsive Thoughts/Behaviors: None  Cognitive Functioning Concentration: Normal Memory: Recent Intact, Remote Intact IQ: Average Insight: Poor Impulse Control: Fair Appetite: Fair Weight Loss: 0 Weight Gain: 0 Sleep: No Change Total Hours of Sleep: 5 Vegetative Symptoms: None  ADLScreening [facility]) Patient's cognitive ability adequate to safely complete  daily activities?: Yes Patient able to express need for assistance with ADLs?: Yes Independently performs ADLs?: Yes (appropriate for developmental age)  Prior Inpatient Therapy Prior Inpatient Therapy: No Prior Therapy Dates: na Prior Therapy Facilty/Provider(s): na Reason for Treatment: na  Prior Outpatient Therapy Prior Outpatient Therapy: Yes Prior Therapy Dates: In high school and 2007 Prior Therapy Facilty/Provider(s): unknown providers Reason for Treatment: Relationship problems  ADL Screening (condition at time of admission) Patient's cognitive ability adequate to safely complete daily activities?: Yes Is the patient deaf or have difficulty hearing?: No Does the patient have difficulty seeing, even when wearing glasses/contacts?: No Does the patient have difficulty concentrating, remembering, or making decisions?: No Patient able to express need for assistance with ADLs?: Yes Does the patient have difficulty dressing or bathing?: No Independently performs ADLs?: Yes (appropriate for developmental age) Does the patient have difficulty walking or climbing stairs?: No  Home Assistive Devices/Equipment Home Assistive Devices/Equipment: None    Abuse/Neglect Assessment (Assessment to be complete while patient is alone) Physical Abuse: Denies Verbal Abuse: Denies Sexual Abuse: Denies Exploitation of patient/patient's resources: Denies Self-Neglect: Denies Values / Beliefs Cultural Requests During Hospitalization: None Spiritual Requests During Hospitalization: None Consults Spiritual Care Consult Needed: No Social Work Consult Needed: No Merchant navy officerAdvance Directives (For Healthcare) Does patient have an advance directive?: No Would patient like information on creating an advanced directive?: No - patient declined information    Additional Information 1:1 In Past 12 Months?: No CIRT Risk: No Elopement Risk: No Does patient have medical clearance?: Yes     Disposition:   Disposition Initial Assessment Completed for this Encounter:  Yes Disposition of Patient: Other dispositions Other disposition(s): Other (Comment) (Pt ot be evaluated by psychiatry in AM)  On Site Evaluation by:   Reviewed with Physician:    Caryl ComesButler, Jammal Sarr Kristen 01/28/2014 10:23 AM

## 2014-01-28 NOTE — ED Notes (Signed)
Pt transported to [facility] by [facility] for continuation of specialized care. He left in no acute distress. 

## 2014-01-28 NOTE — Tx Team (Signed)
Initial Interdisciplinary Treatment Plan   PATIENT STRESSORS: Financial difficulties Medication change or noncompliance Occupational concerns   PATIENT STRENGTHS: Capable of independent living General fund of knowledge Supportive family/friends   PROBLEM LIST: Problem List/Patient Goals Date to be addressed Date deferred Reason deferred Estimated date of resolution  Depression with suicidal gesture                                                       DISCHARGE CRITERIA:  Improved stabilization in mood, thinking, and/or behavior Need for constant or close observation no longer present Verbal commitment to aftercare and medication compliance  PRELIMINARY DISCHARGE PLAN: Attend aftercare/continuing care group Return to previous living arrangement Return to previous work or school arrangements  PATIENT/FAMIILY INVOLVEMENT: This treatment plan has been presented to and reviewed with the patient, Tommy [name], and/or family member, .  The patient and family have been given the opportunity to ask questions and make suggestions.  Tommy [name], Tommy [name] 01/28/2014, 10:31 PM

## 2014-01-28 NOTE — ED Notes (Addendum)
Pt has shoes, shorts, shirt, earrings,  2 armbands and 2 hairbands.  Pt has been seen and wanded by security.

## 2014-01-28 NOTE — ED Notes (Signed)
PA at bedside.

## 2014-01-28 NOTE — ED Notes (Signed)
D: Patient affect sad and flat. Patient mood sad and depressed. Patient mother visited this afternoon; supportive in interactions with patient. Patient and mother requested to speak with TTS regarding IVC papers. Patient expressed concerns regarding missing work and identified finances as a stressor. He states that he works 2 jobs and is currently responsible for the bills for he and his mother as she is currently not working. Patient's mother is recovering from hip surgery.  A: Support provided through active listening. Medications administered per order. Patient education provided for paxil. Kristin from TTS also met with patient and patient's mother to discuss IVC paperwork. Plan of care also discussed with patient.  R: Patient verbalized understanding of plan of care and verbalized understanding of teaching presenting regarding paxil. Patient continues to intermittently express thoughts of SI. Patient verbally contracts for safety.

## 2014-01-29 DIAGNOSIS — F329 Major depressive disorder, single episode, unspecified: Secondary | ICD-10-CM

## 2014-01-29 LAB — TSH: TSH: 0.861 u[IU]/mL (ref 0.350–4.500)

## 2014-01-29 NOTE — [facility] Suicide Risk Assessment (Signed)
   Nursing information obtained from:    Demographic factors:   28 year old single employed male  Current Mental Status:   See below Loss Factors:   financial and relationship stressors /working two jobs  Historical Factors:   mild depression in the past  Risk Reduction Factors:   resilience, social support, physical health Total Time spent with patient: 45 minutes  CLINICAL FACTORS:  Recent depression and suicidal ideations  Psychiatric Specialty Exam: Physical Exam  ROS  Blood pressure 132/90, pulse 73, temperature 97.8 F (36.6 C), temperature source Oral, resp. rate 20, height 5\' 11"  (1.803 m), weight 146.058 kg (322 lb).Body mass index is 44.93 kg/(m^2).  SEE ADMIT NOTE MSE   COGNITIVE FEATURES THAT CONTRIBUTE TO RISK:  Closed-mindedness    SUICIDE RISK:   Mild:  Suicidal ideation of limited frequency, intensity, duration, and specificity.  There are no identifiable plans, no associated intent, mild dysphoria and related symptoms, good self-control (both objective and subjective assessment), few other risk factors, and identifiable protective factors, including available and accessible social support.  PLAN OF CARE: Patient will be admitted to inpatient psychiatric unit for stabilization and safety. Will provide and encourage milieu participation. Provide medication management and maked adjustments as needed.  Will follow daily.    I certify that inpatient services furnished can reasonably be expected to improve the patient's condition.  COBOS, FERNANDO 01/29/2014, 5:03 PM
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
## 2014-01-29 NOTE — [facility] Group Notes (Signed)
[facility] LCSW Group Therapy 01/29/2014  1:15 PM Type of Therapy: Group Therapy Participation Level: Active  Participation Quality: Attentive, Sharing and Supportive  Affect: Depressed and Flat  Cognitive: Alert and Oriented  Insight: Developing/Improving and Engaged  Engagement in Therapy: Developing/Improving and Engaged  Modes of Intervention: Clarification, Confrontation, Discussion, Education, Exploration, Limit-setting, Orientation, Problem-solving, Rapport Building, Dance movement psychotherapisteality Testing, Socialization and Support  Summary of Progress/Problems: The topic for group today was emotional regulation. This group focused on both positive and negative emotion identification and allowed group members to process ways to identify feelings, regulate negative emotions, and find healthy ways to manage internal/external emotions. Group members were asked to reflect on a time when their reaction to an emotion led to a negative outcome and explored how alternative responses using emotion regulation would have benefited them. Group members were also asked to discuss a time when emotion regulation was utilized when a negative emotion was experienced. Patient discussed wanting to be more patient and discussed feeling overwhelmed by caring for his mother financially. Patient identified prayer and exercising as ways to relieve stress. CSW's and other group members provided emotional support and encouragement.  Samuella BruinKristin Burley Kopka, MSW, [employer] Clinical Social Worker [facility] [phone]

## 2014-01-29 NOTE — H&P (Signed)
Psychiatric Admission Assessment Adult  Patient Identification:  Tommy [name] Date of Evaluation:  01/29/2014 Chief Complaint:  " I've been frustrated"  History of Present Illness: 28 year old man, lives with mother. States he has been under a lot of stress lately, particularly financial concerns " bills to pay", mother recently had a hip replacement and is therefore out of work, resulting in patient having increased financial responsibilities, and not seeing his girlfriend enough because they are on different work schedules. Patient also states he has been trying to keep up with having two jobs, so has had little time for himself or for his girlfriend. He has had " not enough time to wind down, not enough time for sleep"  In the context of all these stressors he became more depressed, feeling overwhelmed, and developed some suicidal ideations, which he describes as mostly  passive , without any specific plan or intent.  Yesterday, however,  had some  Thoughts of cutting , and actually took a knife in his hand, but did not actually cut himself. His mother became concerned and contacted 911. Patient was admitted on commitment. Elements:  Worsening Depression and feelings of being overwhelmed in the context of significant stressors, resulting in suicidal ideations.  Associated Signs/Synptoms: Depression Symptoms:  anhedonia, recurrent thoughts of death, insomnia, decreased appetite, (Hypo) Manic Symptoms: denies any history of mania Anxiety Symptoms:  Denies any significant anxiety  Psychotic Symptoms:  Denies hallucinations, no delusions PTSD Symptoms: No PTSD  Total Time spent with patient: 45 minutes  Psychiatric Specialty Exam: Physical Exam  Review of Systems  Constitutional: Negative for fever and chills.  Respiratory: Negative for cough and shortness of breath.   Cardiovascular: Negative for chest pain.  Gastrointestinal: Negative for heartburn, nausea, vomiting and blood in  stool.  Genitourinary: Negative for dysuria, urgency and frequency.  Musculoskeletal: Negative for myalgias and back pain.  Skin: Negative for rash.  Neurological: Negative for seizures, loss of consciousness and headaches.  Psychiatric/Behavioral: Positive for depression and suicidal ideas.    Blood pressure 111/62, pulse 80, temperature 97.8 F (36.6 C), temperature source Oral, resp. rate 20, height 5\' 11"  (1.803 m), weight 146.058 kg (322 lb).Body mass index is 44.93 kg/(m^2).  General Appearance: improved grooming   Eye Contact::  Good  Speech:  Normal Rate  Volume:  Normal  Mood:  denies any depression at this time, presents euthymic  Affect:  Appropriate  Thought Process:  Goal Directed and Linear  Orientation:  Full (Time, Place, and Person)  Thought Content:  denies hallucinations and no delusions. At this time future oriented and is hoping to be discahrged soon so he can return to work soon.  Suicidal Thoughts:  No- at this time denies any thoughts of hurting self and  contracts for safety on unit   Homicidal Thoughts:  No  Memory:  Recent and remote grossly intact   Judgement:  Other:  improved  Insight:  Fair  Psychomotor Activity:  Normal  Concentration:  Good  Recall:  Good  Fund of Knowledge:Good  Language: NA  Akathisia:  Negative  Handed:  Right  AIMS (if indicated):     Assets:  Communication Skills Desire for Improvement Financial Resources/Insurance Housing Physical Health Resilience Social Support Talents/Skills  Sleep:       Musculoskeletal: Strength & Muscle Tone: within normal limits Gait & Station: normal Patient leans: N/A  Past Psychiatric History: Diagnosis: Denies any formal psychiatric history, has had some prior episodes of depression but reports them  as mild, no history of mania or psychosis or PTSD   Hospitalizations: No psychiatric admissions in the past   Outpatient Care: denies having any outpatient treatment  Substance Abuse  Care: denies   Self-Mutilation:  Denies   Suicidal Attempts: denies   Violent Behaviors: denies    Past Medical History:  Denies any medical illnesses - does not smoke  Past Medical History  Diagnosis Date  . ALLERGIC RHINITIS 08/25/2009    Qualifier: Diagnosis of  By: Plotnikov MD, Georgina QuintAleksei V   . HAND PAIN 08/25/2009    Qualifier: Diagnosis of  By: Plotnikov MD, Georgina QuintAleksei V   . Anxiety    Loss of Consciousness:  denies Seizure History:  denies Cardiac History:  denies  Allergies:  No Known Allergies- allergic to cats  PTA Medications: Prescriptions prior to admission  Medication Sig Dispense Refill Last Dose  . ketoconazole (NIZORAL) 2 % shampoo Apply topically 2 (two) times a week. As directed 360 mL 3 2 weeks ago  . ketoconazole (NIZORAL) 200 MG tablet Take 1 tablet (200 mg total) by mouth daily. 14 tablet 0 2-3 weeks ago  . naproxen (NAPROSYN) 500 MG tablet Take 1 tablet (500 mg total) by mouth 2 (two) times daily as needed for moderate pain (pc). 60 tablet 2 more than a month  . PARoxetine (PAXIL) 40 MG tablet Take 1 tablet (40 mg total) by mouth daily. 30 tablet 5 more than a month    Previous Psychotropic Medications:  Medication/Dose  Patient states he has been on Paxil 40 mgrs a day for two months- he states " it helps" and does not endorse side effects.  Prescribed by PCP  No other psychiatric medications.             Substance Abuse History in the last 12 months:  No. denies alcohol , drug abuse history   Consequences of Substance Abuse: none   Social History:  reports that he has quit smoking. He does not have any smokeless tobacco history on file. He reports that he drinks alcohol. He reports that he does not use illicit drugs. Additional Social History: Pain Medications: none Prescriptions: see med list Over the Counter: see med list History of alcohol / drug use?: No history of alcohol / drug abuse  Current Place of Residence:  Lives with mother  Place of  Birth:   Family Members: Marital Status:  Single Children: No kids   Sons:  Daughters: Relationships: has girlfriend  Education:  [employer] Educational Problems/Performance: Religious Beliefs/Practices: History of Abuse (Emotional/Phsycial/Sexual) Occupational Experiences; working a full time job and a part time Public affairs consultantjob  Military History:  None. Legal History: Denies  Hobbies/Interests:  Family History:  Father alive, distant relationship with patient. He is close to mother. Has two brothers. No mental illness in family, no suicides in family, denies alcoholism in family.  Family History  Problem Relation Age of Onset  . Arthritis Mother     Results for orders placed or performed during the hospital encounter of 01/28/14 (from the past 72 hour(s))  TSH     Status: None   Collection Time: 01/29/14  6:28 AM  Result Value Ref Range   TSH 0.861 0.350 - 4.500 uIU/mL    [facility]   Psychological Evaluations:  Assessment:   Patient is a 28 year old man with no prior psychiatric history other than mild depression. He states that recently he was feeling depressed and overwhelmed in the context of a  series of stressors. Namely, his mother , with whom he lives, had a recent hip replacement and is now out of work, resulting in an increased financial burden on patient. He has been trying to keep up with a full time and a part time job, and states he has been unable to see his girlfriend, as their work schedules do not Microbiologist. He had some passive thoughts of death, and yesterday developed a thought of cutting self, and reportedly had a knife in his hand at one point. He did not actually cut self. Mother contacted police, resulting in his admission. At this time patient reports a sense of shame regarding this incident, states it is highly out of character for him, and denies any depression. He appears euthymic, with a full range of affect, minimal current neuro-vegetative  symptoms of depression, and no SI or any psychotic symptoms. He was started on Paxil a few weeks ago by his PCP , and feels it is working.  DSM5:   AXIS I: Depression NOS, Consider Adjustment Disorder with Depressed Mood  AXIS II:  Deferred AXIS III:   Past Medical History  Diagnosis Date  . ALLERGIC RHINITIS 08/25/2009    Qualifier: Diagnosis of  By: Plotnikov MD, Georgina Quint   . HAND PAIN 08/25/2009    Qualifier: Diagnosis of  By: Plotnikov MD, Georgina Quint   . Anxiety    AXIS IV:  economic problems, occupational problems and problems related to social environment AXIS V:  41-50 serious symptoms  Treatment Plan/Recommendations:  See below  Treatment Plan Summary: Daily contact with patient to assess and evaluate symptoms and progress in treatment Medication management See below Current Medications:  Current Facility-Administered Medications  Medication Dose Route Frequency Provider Last Rate Last Dose  . acetaminophen (TYLENOL) tablet 650 mg  650 mg Oral Q6H PRN Kerry Hough, PA-C      . alum & mag hydroxide-simeth (MAALOX/MYLANTA) 200-200-20 MG/5ML suspension 30 mL  30 mL Oral Q4H PRN Kerry Hough, PA-C      . hydrOXYzine (ATARAX/VISTARIL) tablet 25 mg  25 mg Oral Q6H PRN Kerry Hough, PA-C      . magnesium hydroxide (MILK OF MAGNESIA) suspension 30 mL  30 mL Oral Daily PRN Kerry Hough, PA-C      . naproxen (NAPROSYN) tablet 500 mg  500 mg Oral BID PRN Kerry Hough, PA-C      . PARoxetine (PAXIL) tablet 40 mg  40 mg Oral Daily Kerry Hough, PA-C   40 mg at 01/29/14 0746  . traZODone (DESYREL) tablet 50 mg  50 mg Oral QHS,MR X 1 Spencer E Simon, PA-C   50 mg at 01/28/14 2200    Observation Level/Precautions:  15 minute checks  Laboratory:  As needed   Psychotherapy:  Milieu/group   Medications:  Continue Paxil at 40 mgrs a day, and Trazodone PRN for insomnia  Consultations:  If needed   Discharge Concerns:  -  Estimated LOS: 3 days   Other:     I certify  that inpatient services furnished can reasonably be expected to improve the patient's condition.   COBOS, FERNANDO 11/18/20154:25 PM

## 2014-01-29 NOTE — [facility] Group Notes (Signed)
   [facility]  01/29/2014  8:45 AM   Participation Quality: Alert, Appropriate and Oriented  Mood/Affect: Appropriate  Depression Rating: 0  Anxiety Rating: 0  Thoughts of Suicide: Pt denies SI/HI  Will you contract for safety? Yes  Current AVH: Pt denies  Plan for Discharge/Comments: Pt attended discharge planning group and actively participated in group. CSW provided pt with today's workbook. Patient did not feel comfortable sharing his reason for hospitalization in group but reports that he is doing okay today and denies depression or anxiety. Patient reports feeling worried about missing work.  Transportation Means: CSW continuing to assess.  Supports: No supports mentioned at this time  Samuella BruinKristin Delona Clasby, MSW, [employer] Clinical Social Worker [facility] [phone]

## 2014-01-29 NOTE — Clinical Social Work Note (Signed)
Per patient request, CSW left voicemails for his employers Clarene Reamerravis Martin [phone]) and Laureen OchsJackie Duffy [phone]) regarding his hospitalization.  Samuella BruinKristin Kaylen Motl, MSW, [employer]CS[employer] Clinical Social Worker [facility] [phone]

## 2014-01-29 NOTE — [facility] Suicide Risk Assessment (Signed)
[facility] INPATIENT:  Family/Significant Other Suicide Prevention Education  Suicide Prevention Education:  Patient Refusal for Family/Significant Other Suicide Prevention Education: The patient Tommy [name] has refused to provide written consent for family/significant other to be provided Family/Significant Other Suicide Prevention Education during admission and/or prior to discharge.  Physician notified. SPE reviewed with patient, brochure provided.  Marque Rademaker, [place] CarboKristin L 01/29/2014, 3:56 PM

## 2014-01-29 NOTE — Progress Notes (Signed)
Patient ID: Tommy GrainWilliam R [name], male   DOB: September 22, 1985, 28 y.o.   MRN: [id number] He has been in bed parts of the day. Did attend two groups. Has had poor interaction with peers . Has not requested and pain medication. Self inventory this AM. Denies depression, hopelessness, and anxiety. Denies SI thoughts and pain. His goal is to see MD. To tell him he wants to be discharged so that he can go back to work.

## 2014-01-29 NOTE — Progress Notes (Signed)
Patient in bed all evening. Denied SI/HI and denied Hallucinations. Mood and affects appropriate. Encouraged and supported patient. Q 15 minute check continues as ordered to maintain safety.

## 2014-01-30 MED ORDER — PAROXETINE HCL 20 MG PO TABS
20.0000 mg | ORAL_TABLET | Freq: Every day | ORAL | Status: DC
  Filled 2014-01-30 (×2): qty 1

## 2014-01-30 NOTE — [facility] Group Notes (Signed)
[facility] LCSW Group Therapy 01/30/2014 1:15 PM Type of Therapy: Group Therapy Participation Level: Active  Participation Quality: Attentive, Sharing and Supportive  Affect: Appropriate  Cognitive: Alert and Oriented  Insight: Developing/Improving and Engaged  Engagement in Therapy: Developing/Improving and Engaged  Modes of Intervention: Activity, Clarification, Confrontation, Discussion, Education, Exploration, Limit-setting, Orientation, Problem-solving, Rapport Building, Dance movement psychotherapisteality Testing, Socialization and Support  Summary of Progress/Problems: Patient was attentive and engaged with speaker from [employer]. Patient was attentive to speaker while they shared their story of dealing with mental health and overcoming it. Patient expressed interest in their programs and services and received information on their agency. Patient processed ways they can relate to the speaker.   Samuella BruinKristin Braylie Badami, MSW, [employer] Clinical Social Worker [facility] [phone]

## 2014-01-30 NOTE — Progress Notes (Signed)
D   Pt isolated to his room and did not attend groups   He denies suicidal ideation but does express worry concerning his job and does want to be discharged   He is hoping to be discharged Friday  A   Verbal support given   Medications administered and effectiveness monitored   Q 15 min checks R   Pt safe at present

## 2014-01-30 NOTE — Progress Notes (Signed)
Recreation Therapy Notes  Animal-Assisted Activity/Therapy (AAA/T) Program Checklist/Progress Notes Patient Eligibility Criteria Checklist & Daily Group note for Rec Tx Intervention  Date: 11.19.2015 Time: 2:45pm Location: [address] Dayroom    AAA/T Program Assumption of Risk Form signed by Patient/ or Parent Legal Guardian yes  Patient is free of allergies or sever asthma yes  Patient reports no fear of animals yes  Patient reports no history of cruelty to animals yes   Patient understands his/her participation is voluntary yes  Behavioral Response: Did not attend.   Marykay Lexenise L Chaz Ronning, LRT/CTRS  Graciano Batson L 01/30/2014 4:22 PM

## 2014-01-30 NOTE — Progress Notes (Signed)
Pt attended NA group this evening.  

## 2014-01-30 NOTE — Progress Notes (Signed)
[facility] MD Progress Note  01/30/2014 12:06 PM Tommy [name]  MRN:  [id number] Subjective:   States that today he is feeling "OK". He states  is not having any thoughts of suicide, he is looking forward to being discharged soon so he can reunite his family and return to work. Objective: I have discussed case with treatment team and have met with patient. As per staff, patient has remained calm, pleasant, cooperative, and has been active in milieu. He has denied any SI and has seemed euthymic. Patient presents euthymic at this time and denies depression. He is future oriented and hoping to be discharged soon so " I can get back to work".  He states he was visited by his mother yesterday and visit went well. He states he is thinking of giving up all or some of his part time work, to have more time for himself and to relax. States Paxil at 49 mgrs QDAY causes him to feel somewhat tired.  TSH within normal limits.  Diagnosis:  Depression NOS- consider Adjustment Disorder  Total Time spent with patient: 25 minutes    ADL's: improved   Sleep: good   Appetite: good   Suicidal Ideation:  Denies any suicidal ideations Homicidal Ideation:  Denies any homicidal ideations AEB (as evidenced by):  Psychiatric Specialty Exam: Physical Exam  Review of Systems  Constitutional: Negative for fever, chills and malaise/fatigue.  Respiratory: Negative for cough and shortness of breath.   Cardiovascular: Negative for chest pain.  Gastrointestinal: Negative for nausea, vomiting, diarrhea and blood in stool.  Musculoskeletal: Negative for myalgias, back pain, joint pain and neck pain.  Skin: Negative for rash.  Neurological: Negative for seizures, loss of consciousness and headaches.  Psychiatric/Behavioral: Negative for suicidal ideas.    Blood pressure 123/82, pulse 83, temperature 98.9 F (37.2 C), temperature source Oral, resp. rate 16, height $RemoveBe'5\' 11"'pdJhdEcae$  (1.803 m), weight 146.058 kg (322 lb).Body  mass index is 44.93 kg/(m^2).  General Appearance: Well Groomed  Engineer, water::  Good  Speech:  Clear and Coherent  Volume:  Normal  Mood:  Euthymic  Affect:  Full Range  Thought Process:  Goal Directed and Linear  Orientation:  Full (Time, Place, and Person)  Thought Content:  denies hallucinations, no delusions  Suicidal Thoughts:  No At this time denies any suicidal plan or intent and contracts for safety on the unit  Homicidal Thoughts:  No  Memory:  Recent and Remote grossly intact  Judgement:  Good  Insight:  Good  Psychomotor Activity:  Normal  Concentration:  Good  Recall:  Good  Fund of Knowledge:Good  Language: Good  Akathisia:  Yes  Handed:  Right  AIMS (if indicated):     Assets:  Communication Skills Desire for Improvement Physical Health Resilience Social Support  Sleep:      Musculoskeletal: Strength & Muscle Tone: within normal limits Gait & Station: normal Patient leans: N/A  Current Medications: Current Facility-Administered Medications  Medication Dose Route Frequency Provider Last Rate Last Dose  . acetaminophen (TYLENOL) tablet 650 mg  650 mg Oral Q6H PRN Laverle Hobby, PA-C      . alum & mag hydroxide-simeth (MAALOX/MYLANTA) 200-200-20 MG/5ML suspension 30 mL  30 mL Oral Q4H PRN Laverle Hobby, PA-C      . hydrOXYzine (ATARAX/VISTARIL) tablet 25 mg  25 mg Oral Q6H PRN Laverle Hobby, PA-C      . magnesium hydroxide (MILK OF MAGNESIA) suspension 30 mL  30 mL Oral Daily PRN  Laverle Hobby, PA-C      . naproxen (NAPROSYN) tablet 500 mg  500 mg Oral BID PRN Laverle Hobby, PA-C      . PARoxetine (PAXIL) tablet 40 mg  40 mg Oral Daily Laverle Hobby, PA-C   40 mg at 01/30/14 0813  . traZODone (DESYREL) tablet 50 mg  50 mg Oral QHS,MR X 1 Laverle Hobby, PA-C   50 mg at 01/28/14 2200    Lab Results:  Results for orders placed or performed during the hospital encounter of 01/28/14 (from the past 48 hour(s))  TSH     Status: None   Collection  Time: 01/29/14  6:28 AM  Result Value Ref Range   TSH 0.861 0.350 - 4.500 uIU/mL    Comment: Performed at [facility]    Physical Findings: AIMS: Facial and Oral Movements Muscles of Facial Expression: None, normal Lips and Perioral Area: None, normal Jaw: None, normal Tongue: None, normal,Extremity Movements Upper (arms, wrists, hands, fingers): None, normal Lower (legs, knees, ankles, toes): None, normal, Trunk Movements Neck, shoulders, hips: None, normal, Overall Severity Severity of abnormal movements (highest score from questions above): None, normal Incapacitation due to abnormal movements: None, normal Patient's awareness of abnormal movements (rate only patient's report): No Awareness, Dental Status Current problems with teeth and/or dentures?: No Does patient usually wear dentures?: No  CIWA:    COWS:      Assessment: At this time patient is denying depression and presenting with full range of affect. He is not endorsing any suicidal ideations and behavior is calm and in good control. Desribes feeling tired at higher Paxil dose .   Treatment Plan Summary: Daily contact with patient to assess and evaluate symptoms and progress in treatment Medication management See below  Plan: Continue inpatient treatment and support  Consider discharge soon as he continues to stabilize Decrease Paxil to 20 mgrs QDAY    Medical Decision Making Problem Points:  Established problem, stable/improving (1), Review of last therapy session (1) and Review of psycho-social stressors (1) Data Points:  Review or order clinical lab tests (1) Review of new medications or change in dosage (2)  I certify that inpatient services furnished can reasonably be expected to improve the patient's condition.   COBOS, [place] 01/30/2014, 12:06 PM

## 2014-01-30 NOTE — Progress Notes (Signed)
NSG shift assessment. 7a-7p.   D: Affect blunted, mood depressed, behavior appropriate. Denies having depression or hopelessness today and he feels ready to go home. He did not want to take Paxil this morning, but agreed to and did take it. 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 He plans to speak with the doctor and let him know that he has concerns about his job.    A: Observed pt interacting in group and in the milieu: Support and encouragement offered. Safety maintained with observations every 15 minutes.   R:   Contracts for safety and continues to follow the treatment plan, working on learning new coping skills.

## 2014-01-31 DIAGNOSIS — R45851 Suicidal ideations: Secondary | ICD-10-CM | POA: Insufficient documentation

## 2014-01-31 DIAGNOSIS — F321 Major depressive disorder, single episode, moderate: Secondary | ICD-10-CM | POA: Insufficient documentation

## 2014-01-31 DIAGNOSIS — F339 Major depressive disorder, recurrent, unspecified: Secondary | ICD-10-CM | POA: Insufficient documentation

## 2014-01-31 MED ORDER — PAROXETINE HCL 20 MG PO TABS: 20.0000 mg | ORAL_TABLET | Freq: Every day | ORAL | Status: DC

## 2014-01-31 MED ORDER — HYDROXYZINE HCL 25 MG PO TABS
25.0000 mg | ORAL_TABLET | Freq: Four times a day (QID) | ORAL | Status: DC | PRN
Start: 2014-01-31 — End: 2014-04-16

## 2014-01-31 MED ORDER — TRAZODONE HCL 50 MG PO TABS: 50.0000 mg | ORAL_TABLET | Freq: Every evening | ORAL | Status: DC | PRN

## 2014-01-31 MED ORDER — NAPROXEN 500 MG PO TABS: 500.0000 mg | ORAL_TABLET | Freq: Two times a day (BID) | ORAL | Status: DC | PRN

## 2014-01-31 MED ORDER — HYDROXYZINE HCL 25 MG PO TABS: 25.0000 mg | ORAL_TABLET | Freq: Four times a day (QID) | ORAL | Status: DC | PRN

## 2014-01-31 NOTE — Progress Notes (Signed)
D: Patient is alert and oriented. Pt's mood and affect pleasant. Pt rates his depression, hopelessness, and anxiety 0/10. Pt denies SI/HI and AVH. Pt refused 0800 Paxil, states "I don't want it, I feel better now." A: Support given to pt. Medication education reviewed with pt. 15 minute checks completed per protocol for pt safety. R: Pt receptive to nursing interventions.

## 2014-01-31 NOTE — [facility] Group Notes (Signed)
   [facility] Note  01/31/2014  8:45 AM   Participation Quality: Alert, Appropriate and Oriented  Mood/Affect: Appropriate   Depression Rating: 0  Anxiety Rating: 0  Thoughts of Suicide: Pt denies SI/HI  Will you contract for safety? Yes  Current AVH: Pt denies  Plan for Discharge/Comments: Pt attended discharge planning group and actively participated in group. CSW provided pt with today's workbook. Patient reports feeling "pretty good" today and ready to discharge. Patient plans to return home with his mother and follow up with outpatient services yet to be determined due to his work schedule.  Transportation Means: Pt reports access to transportation  Supports: Patient identifies his mother and girlfriend as supports.  Samuella BruinKristin Tareek Sabo, MSW, [employer]CSWA Clinical Social Worker [facility] [phone]

## 2014-01-31 NOTE — Progress Notes (Signed)
[facility] :  Will you be returning to the same living situation after discharge: Yes,  patient will return home At discharge, do you have transportation home?:Yes,  patient reports that he will have transportation Do you have the ability to pay for your medications: Yes, patient will be provided with prescriptions at discharge   Release of information consent forms completed and in the chart;  Patient's signature needed at discharge.  Patient to Follow up at: Follow-up Information    Follow up with Patient declines follow up.   Why:  Please review list of providers for outpatient therapy and medication management services.      Patient denies SI/HI:   Yes,  denies    Aeronautical engineerafety Planning and Suicide Prevention discussed:  Yes,  with patient   Kal Chait, [place] CarboKristin L 01/31/2014, 11:28 AM

## 2014-01-31 NOTE — Discharge Summary (Signed)
Physician Discharge Summary Note  Patient:  Tommy [name] is an 28 y.o., male MRN:  [id number] DOB:  30-Nov-1985 Patient phone:  [phone] (home)  Patient address:   [address] [place] [address],  Total Time spent with patient: Greater than 30 minutes  Date of Admission:  01/28/2014  Date of Discharge: 01/31/14  Reason for Admission: Mood stabilization treatments  Discharge Diagnoses: Active Problems:   MDD (major depressive disorder)   Major depressive disorder, single episode, moderate   Suicidal ideations   Psychiatric Specialty Exam: Physical Exam  Psychiatric: His speech is normal and behavior is normal. Judgment and thought content normal. His mood appears not anxious. His affect is not angry, not blunt, not labile and not inappropriate. Cognition and memory are normal. He does not exhibit a depressed mood.    Review of Systems  Constitutional: Negative.   HENT: Negative.   Eyes: Negative.   Respiratory: Negative.   Cardiovascular: Negative.   Gastrointestinal: Negative.   Genitourinary: Negative.   Musculoskeletal: Negative.   Skin: Negative.   Neurological: Negative.   Endo/Heme/Allergies: Negative.   Psychiatric/Behavioral: Positive for depression (Stable). Negative for suicidal ideas, hallucinations, memory loss and substance abuse. The patient is nervous/anxious (Stable) and has insomnia (Stable).                    Blood pressure 110/73, pulse 112, temperature 98.1 F (36.7 C), temperature source Oral, resp. rate 16, height 5\' 11"  (1.803 m), weight 146.058 kg (322 lb).Body mass index is 44.93 kg/(m^2).  General Appearance: Well Groomed  Patent attorney:: Good  Speech: Normal Rate  Volume: Normal  Mood: Euthymic  Affect: Appropriate and slightly anxious  Thought Process: Goal Directed and Linear  Orientation: Full (Time, Place, and Person)  Thought Content: no hallucinations, no delusions  Suicidal Thoughts: No at  this time denies any thoughts of hurting self and contracts for safety on unit    Homicidal Thoughts: No  Memory: Recent and Remote grossly intact  Judgement: Good  Insight: Present  Psychomotor Activity: Normal  Concentration: Good  Recall: Good  Fund of Knowledge:Good  Language: Good  Akathisia: NA  Handed: Right  AIMS (if indicated):    Assets: Desire for Improvement Physical Health Resilience  Sleep: Number of Hours: 6.75   Past Psychiatric History: Diagnosis: Denies any formal psychiatric history, has had some prior episodes of depression but reports them as mild, no history of mania or psychosis or PTSD   Hospitalizations: No psychiatric admissions in the past   Outpatient Care: denies having any outpatient treatment  Substance Abuse Care: denies   Self-Mutilation: Denies   Suicidal Attempts: denies   Violent Behaviors: denies    Musculoskeletal: Strength & Muscle Tone: within normal limits Gait & Station: normal Patient leans: N/A  DSM5: Schizophrenia Disorders:  NA Obsessive-Compulsive Disorders:  NA Trauma-Stressor Disorders:  NA Substance/Addictive Disorders:  NA Depressive Disorders:  MDD (major depressive disorder)  Axis Diagnosis:  AXIS I:  MDD (major depressive disorder) AXIS II:  Deferred AXIS III:   Past Medical History  Diagnosis Date  . ALLERGIC RHINITIS 08/25/2009    Qualifier: Diagnosis of  By: Plotnikov MD, Georgina Quint   . HAND PAIN 08/25/2009    Qualifier: Diagnosis of  By: Plotnikov MD, Georgina Quint   . Anxiety    AXIS IV:  economic problems and other psychosocial or environmental problems AXIS V:  63  Level of Care:  Declines follow-up care referral/appointment  Hospital Course: 28 year old  man, lives with mother. States he has been under a lot of stress lately, particularly financial concerns " bills to pay", mother recently had a hip replacement and is therefore out of work, resulting in patient having  increased financial responsibilities. He not enough time to wind down, not enough time for sleep" In the context of all these stressors he became more depressed, feeling overwhelmed, and developed some suicidal ideations, which he describes as mostly passive , without any specific plan or intent. Yesterday, however, had some Thoughts of cutting.  After admission assessment/evaluation, it was determined based on patient's reported symptoms that he will need medication management to re-stabilize his current depressive mood symptoms. Tommy [name] was then ordered, medicated & discharged on; Hydroxyzine 25 mg four times daily as needed for anxiety, Paroxetine 20 mg daily for depression and Trazodone 50 Q bedtime for sleep. He also was enrolled and participated in the Group counseling sessions being offered and held on this unit. Tommy [name] learned coping skills that should help him cope better and maintain mood stability after discharge. Whle a patient in this hospital, he presented with other pre-existing medical issues that required treatment and monitoring such as pain episodes. He tolerated his treatment regimen without any significant adverse effects and or reactions reported.  Tommy [name] mood responded well to his treatment regimen. This evidenced by his reports of improved mood, absence of suicidal ideations and presentation of good affects/eye contacts. He appears motivated to continue his recovery after discharge by working closely with the treatment team and case manager to develop a discharge plan with appropriate goals to maintain mood stability after discharge. Coping skills, problem solving as well as relaxation therapies were also part of his unit programming. On the day of discharge, Tommy [name] was in much improved condition than upon admission.  His symptoms were reported as significantly decreased or resolved completely. Upon discharge, he denies any SI/HI, AVH, delusional thoughts and or paranoia. He  was instructed and encouraged to continue taking medication with a goal of continued improvement in mental health.    Upon discharge, He received a 14 days worth, supply [facility]. [facility] in no apparent distress. Wiiliam arranged his own transportation.  Consults:  psychiatry  Significant Diagnostic Studies:  labs: CBC with diff, CMP, UDS, toxicology tests, U/A, results reviewed, stable  Discharge Vitals:   Blood pressure 110/73, pulse 112, temperature 98.1 F (36.7 C), temperature source Oral, resp. rate 16, height 5\' 11"  (1.803 m), weight 146.058 kg (322 lb). Body mass index is 44.93 kg/(m^2). Lab Results:   Results for orders placed or performed during the hospital encounter of 01/28/14 (from the past 72 hour(s))  TSH     Status: None   Collection Time: 01/29/14  6:28 AM  Result Value Ref Range   TSH 0.861 0.350 - 4.500 uIU/mL    Comment[facility]    Physical Findings: AIMS: Facial and Oral Movements Muscles of Facial Expression: None, normal Lips and Perioral Area: None, normal Jaw: None, normal Tongue: None, normal,Extremity Movements Upper (arms, wrists, hands, fingers): None, normal Lower (legs, knees, ankles, toes): None, normal, Trunk Movements Neck, shoulders, hips: None, normal, Overall Severity Severity of abnormal movements (highest score from questions above): None, normal Incapacitation due to abnormal movements: None, normal Patient's awareness of abnormal movements (rate only patient's report): No Awareness, Dental Status Current problems with teeth and/or dentures?: No Does patient usually wear dentures?: No  CIWA:  COWS:     Psychiatric Specialty Exam: See Psychiatric Specialty Exam and Suicide Risk Assessment completed by Attending Physician prior to discharge.  Discharge destination:  Home  Is patient on multiple antipsychotic therapies at discharge:  No    Has Patient had three or more failed trials of antipsychotic monotherapy by history:  No  Recommended Plan for Multiple Antipsychotic Therapies: NA    Medication List    STOP taking these medications        ketoconazole 2 % shampoo  Commonly known as:  NIZORAL     ketoconazole 200 MG tablet  Commonly known as:  NIZORAL      TAKE these medications      Indication   hydrOXYzine 25 MG tablet  Commonly known as:  ATARAX/VISTARIL  Take 1 tablet (25 mg total) by mouth every 6 (six) hours as needed for anxiety.   Indication:  Tension, Anxiety     naproxen 500 MG tablet  Commonly known as:  NAPROSYN  Take 1 tablet (500 mg total) by mouth 2 (two) times daily as needed for moderate pain (pc).   Indication:  Mild to Moderate Pain     PARoxetine 20 MG tablet  Commonly known as:  PAXIL  Take 1 tablet (20 mg total) by mouth daily. For depression   Indication:  Major Depressive Disorder     traZODone 50 MG tablet  Commonly known as:  DESYREL  Take 1 tablet (50 mg total) by mouth at bedtime and may repeat dose one time if needed. For sleep   Indication:  Trouble Sleeping       Follow-up Information    Follow up with Patient declines follow up.   Why:  Please review list of providers for outpatient therapy and medication management services.    Follow-up recommendations:  Activity:  As tolerated Diet: As recommended by your primary care doctor. Keep all scheduled follow-up appointments as recommended.  Comments: Take all your medications as prescribed by your mental healthcare provider. Report any adverse effects and or reactions from your medicines to your outpatient provider promptly. Patient is instructed and cautioned to not engage in alcohol and or illegal drug use while on prescription medicines. In the event of worsening symptoms, patient is instructed to call the crisis hotline, 911 and or go to the nearest ED for appropriate evaluation and treatment of  symptoms. Follow-up with your primary care provider for your other medical issues, concerns and or health care needs.   Total Discharge Time:  Greater than 30 minutes.  Signed: Sanjuana KavaNwoko, Agnes I, PMHNP, FNP 01/31/2014, 3:29 PM   Patient seen, Suicide Assessment Completed.  Disposition Plan Reviewed

## 2014-01-31 NOTE — [facility] Group Notes (Signed)
[facility] Group Notes:  activities  Date:  01/31/2014  Time:  10:55 AM  Type of Therapy:  Psychoeducational Skills  Participation Level:  Did Not Attend  Participation Quality:  Inattentive  Affect:  Flat  Cognitive:  Lacking  Insight:  None  Engagement in Group:  Lacking  Modes of Intervention:  Discussion  Summary of Progress/Problems:  Nicole CellaWebb, Kania Regnier Guyes 01/31/2014, 10:55 AM

## 2014-01-31 NOTE — [facility] Suicide Risk Assessment (Signed)
Demographic Factors:  28 year old man, single, employed, lives with mother  Total Time spent with patient: 30 minutes  Psychiatric Specialty Exam: Physical Exam  ROS  Blood pressure 110/73, pulse 112, temperature 98.1 F (36.7 C), temperature source Oral, resp. rate 16, height 5\' 11"  (1.803 m), weight 146.058 kg (322 lb).Body mass index is 44.93 kg/(m^2).  General Appearance: Well Groomed  Patent attorneyye Contact::  Good  Speech:  Normal Rate  Volume:  Normal  Mood:  Euthymic  Affect:  Appropriate and slightly anxious  Thought Process:  Goal Directed and Linear  Orientation:  Full (Time, Place, and Person)  Thought Content:  no hallucinations, no delusions  Suicidal Thoughts:  No at this time denies any thoughts of hurting self and  contracts for safety on unit     Homicidal Thoughts:  No  Memory: Recent and Remote grossly intact  Judgement:  Good  Insight:  Present  Psychomotor Activity:  Normal  Concentration:  Good  Recall:  Good  Fund of Knowledge:Good  Language: Good  Akathisia:  NA  Handed:  Right  AIMS (if indicated):     Assets:  Desire for Improvement Physical Health Resilience  Sleep:  Number of Hours: 6.75    Musculoskeletal: Strength & Muscle Tone: within normal limits Gait & Station: normal Patient leans: N/A   Mental Status Per Nursing Assessment::   On Admission:     Current Mental Status by Physician: At this time patient is much improved. He is euthymic, with a full range of affect, no depressive symptoms, future oriented and wanting to return to work soon. He is not suicidal , not homicidal and is future oriented. He is not psychotic.  Loss Factors: Increased financial burden after mother had hip surgery and stopped working, working two jobs, with resulting decreased time for self  Historical Factors: Mild episodes of depression in the past, no history of suicidal attempts , no history of self injurious behaviors  Risk Reduction Factors:   Sense of  responsibility to family, Employed, Living with another person, especially a relative and Positive social support  Continued Clinical Symptoms:  As noted, at this time improved, with euthymic mood, and future oriented/optimistic. No SI or HI, no psychotic symptoms.  Cognitive Features That Contribute To Risk:  No gross cognitive deficits noted upon discharge. Is alert , attentive, and oriented x 3   Suicide Risk:  Mild:  Suicidal ideation of limited frequency, intensity, duration, and specificity.  There are no identifiable plans, no associated intent, mild dysphoria and related symptoms, good self-control (both objective and subjective assessment), few other risk factors, and identifiable protective factors, including available and accessible social support.  Discharge Diagnoses:  Depression NOS, Consider Adjustment Disorder with Depressed Mood AXIS I:   AXIS II:  Deferred AXIS III:   Past Medical History  Diagnosis Date  . ALLERGIC RHINITIS 08/25/2009    Qualifier: Diagnosis of  By: Plotnikov MD, Georgina QuintAleksei V   . HAND PAIN 08/25/2009    Qualifier: Diagnosis of  By: Plotnikov MD, Georgina QuintAleksei V   . Anxiety    AXIS IV: working two jobs, Environmental consultantfinancial issues  AXIS V:  61-70 mild symptoms  Plan Of Care/Follow-up recommendations:  Activity:  As tolerated Diet:  Regular Tests:  NA Other:  See below  Is patient on multiple antipsychotic therapies at discharge:  No   Has Patient had three or more failed trials of antipsychotic monotherapy by history:  No  Recommended Plan for Multiple Antipsychotic Therapies: NA  Patient is leaving unit in good spirits. He plans to return home. He states he is going to try to cut down on hours he is working per week to have more personal time. Will follow up for outpatient mental health treatment and support.   COBOS, FERNANDO 01/31/2014, 10:45 AM

## 2014-01-31 NOTE — Tx Team (Signed)
Interdisciplinary Treatment Plan Update (Adult) Date: 01/31/2014   Time Reviewed: 9:30 AM  Progress in Treatment: Attending groups: Yes Participating in groups: Yes Taking medication as prescribed: Yes Tolerating medication: Yes Family/Significant other contact made: No, patient has declined for CSW to speak with family Patient understands diagnosis: Yes Discussing patient identified problems/goals with staff: Yes Medical problems stabilized or resolved: Yes Denies suicidal/homicidal ideation: Yes, denies Issues/concerns per patient self-inventory: Yes Other:  New problem(s) identified: N/A  Discharge Plan or Barriers: Patient agreeable to return home to follow up with outpatient services yet to be determined due to patient's work schedule.   Reason for Continuation of Hospitalization:  Depression Anxiety Medication Stabilization   Comments: N/A  Estimated length of stay: Discharge anticipated for today 01/31/14.  For review of initial/current patient goals, please see plan of care. Patient is a 28 year old African American Male with a diagnosis of Major Depressive Disorder, Recurrent Episode, Severe. [place]. Patient has been caring for his mother financially and working 2 jobs. He also reports that not spending time with his girlfriend due to work schedule is a stressor. Patient report denies SI, depression, and anxiety at this time, is wanting to return home to return to work. Patient has no current outpatient providers, is open to therapy services if he can find something compatible to his work schedule. Patient believes that communicating in groups will be helpful. Patient will benefit from crisis stabilization, medication evaluation, group therapy, and psycho education in addition to case management for discharge planning. Patient and CSW reviewed pt's identified goals and treatment plan. Pt verbalized understanding and agreed to treatment  plan.   Attendees: Patient:    Family:    Physician: Dr. Jama Flavorsobos 01/31/2014 9:30 AM  Nursing: Harold Barbanonecia Byrd, Lendell CapriceBrittany Guthrie RN 01/31/2014 9:30 AM  Clinical Social Worker: Samuella BruinKristin Temeca Somma,  LCSWA 01/31/2014 9:30 AM  Other: Juline PatchQuylle Hodnett, LCSW 01/31/2014 9:30 AM  Other: Leisa LenzValerie Enoch, Vesta MixerMonarch Liaison 01/31/2014 9:30 AM  Other:  Sydell AxonElaina, Pharmacist 01/31/2014 9:30 AM  Other:  01/31/2014 9:30 AM  Other:    Other:    Other:    Other:    Other:     Other:        Scribe for Treatment Team:  Samuella BruinKristin Dahl Higinbotham, MSW, [employer]CSWA [phone]

## 2014-01-31 NOTE — Progress Notes (Signed)
D: Patient in stable condition, ambulatory with a steady gait upon discharge. Pt denies SI/HI and AVH. A: AVS reviewed and a copy was given to pt. Follow up reviewed. Resources reviewed, including [facility]. Prescriptions/Medications given to pt. Belongings returned to pt. Pt was given the opportunity to express concerns and ask questions. R: Pt D/C'd to family.

## 2014-02-04 NOTE — Progress Notes (Signed)
Patient Discharge Instructions:  No documentation was faxed for HBIPS.  Per the SW the patient declined follow up.  Jerelene ReddenSheena E [place], 02/04/2014, 2:36 PM

## 2014-02-13 ENCOUNTER — Ambulatory Visit (INDEPENDENT_AMBULATORY_CARE_PROVIDER_SITE_OTHER): Payer: 59 | Admitting: Internal Medicine

## 2014-02-13 ENCOUNTER — Encounter: Payer: Self-pay | Admitting: Internal Medicine

## 2014-02-13 VITALS — BP 110/80 | HR 73 | Temp 98.1°F | Wt 313.0 lb

## 2014-02-13 DIAGNOSIS — F321 Major depressive disorder, single episode, moderate: Secondary | ICD-10-CM

## 2014-02-13 DIAGNOSIS — R45851 Suicidal ideations: Secondary | ICD-10-CM

## 2014-02-13 DIAGNOSIS — Z23 Encounter for immunization: Secondary | ICD-10-CM

## 2014-02-13 DIAGNOSIS — E559 Vitamin D deficiency, unspecified: Secondary | ICD-10-CM

## 2014-02-13 MED ORDER — PAROXETINE HCL 20 MG PO TABS: 20.0000 mg | ORAL_TABLET | Freq: Every day | ORAL | Status: DC

## 2014-02-13 MED ORDER — VITAMIN D 1000 UNITS PO TABS: 1000.0000 [IU] | ORAL_TABLET | Freq: Every day | ORAL | Status: AC

## 2014-02-13 MED ORDER — KETOCONAZOLE 2 % EX SHAM: MEDICATED_SHAMPOO | CUTANEOUS | Status: AC

## 2014-02-13 NOTE — Patient Instructions (Signed)
Low carb diet 

## 2014-02-13 NOTE — Assessment & Plan Note (Signed)
Resolved

## 2014-02-13 NOTE — Progress Notes (Signed)
Pre visit review using our clinic review tool, if applicable. No additional management support is needed unless otherwise documented below in the visit note. 

## 2014-02-13 NOTE — Assessment & Plan Note (Signed)
Re-start Vit D 

## 2014-02-13 NOTE — Assessment & Plan Note (Signed)
Better Continue with current prescription therapy as reflected on the Med list.  

## 2014-02-13 NOTE — Progress Notes (Signed)
   Subjective:   HPI  F/u hosp stay for depression - better now; he is back working 2 jobs  F/u L knee pain over patella - tightness after playing a lot of basketball (2 times a day, stopped 2 wks ago due to pain) x 2 wks; no injury  F/u on premat ejaculation, anxiety, mild insomnia, dandruff. Doing better  BP Readings from Last 3 Encounters:  02/13/14 110/80  01/31/14 110/73  01/28/14 131/92   Wt Readings from Last 3 Encounters:  02/13/14 313 lb (141.976 kg)  01/28/14 322 lb (146.058 kg)  01/28/14 315 lb (142.883 kg)     Review of Systems  Constitutional: Negative for appetite change, fatigue and unexpected weight change.  HENT: Negative for congestion, nosebleeds, sneezing, sore throat and trouble swallowing.   Eyes: Negative for itching and visual disturbance.  Respiratory: Negative for cough.   Cardiovascular: Negative for chest pain, palpitations and leg swelling.  Gastrointestinal: Negative for nausea, diarrhea, blood in stool and abdominal distention.  Genitourinary: Negative for frequency and hematuria.  Musculoskeletal: Negative for back pain, joint swelling, gait problem and neck pain.  Skin: Negative for rash.  Neurological: Negative for dizziness, tremors, speech difficulty and weakness.  Psychiatric/Behavioral: Positive for sleep disturbance. Negative for suicidal ideas, confusion, dysphoric mood, decreased concentration and agitation. The patient is nervous/anxious.   not suicidal     Objective:   Physical Exam  Constitutional: He is oriented to person, place, and time. He appears well-developed. No distress.  NAD  HENT:  Mouth/Throat: Oropharynx is clear and moist.  Eyes: Conjunctivae are normal. Pupils are equal, round, and reactive to light.  Neck: Normal range of motion. No JVD present. No thyromegaly present.  Cardiovascular: Normal rate, regular rhythm, normal heart sounds and intact distal pulses.  Exam reveals no gallop and no friction rub.   No  murmur heard. Pulmonary/Chest: Effort normal and breath sounds normal. No respiratory distress. He has no wheezes. He has no rales. He exhibits no tenderness.  Abdominal: Soft. Bowel sounds are normal. He exhibits no distension and no mass. There is no tenderness. There is no rebound and no guarding.  Musculoskeletal: Normal range of motion. He exhibits no edema or tenderness.  Lymphadenopathy:    He has no cervical adenopathy.  Neurological: He is alert and oriented to person, place, and time. He has normal reflexes. No cranial nerve deficit. He exhibits normal muscle tone. He displays a negative Romberg sign. Coordination and gait normal.  No meningeal signs  Skin: Skin is warm and dry. No rash noted.  Psychiatric: He has a normal mood and affect. His behavior is normal. Judgment and thought content normal.  L knee not is tender over patella, a little painful knee extension Obese  Lab Results  Component Value Date   WBC 6.3 01/28/2014   HGB 14.0 01/28/2014   HCT 42.7 01/28/2014   PLT 325 01/28/2014   GLUCOSE 100* 01/28/2014   CHOL 173 02/12/2013   TRIG 134.0 02/12/2013   HDL 65.10 02/12/2013   LDLCALC 81 02/12/2013   ALT 8 01/28/2014   AST 17 01/28/2014   NA 146 01/28/2014   K 4.0 01/28/2014   CL 106 01/28/2014   CREATININE 0.84 01/28/2014   BUN 11 01/28/2014   CO2 24 01/28/2014   TSH 0.861 01/29/2014          Assessment & Plan:

## 2014-04-16 ENCOUNTER — Ambulatory Visit (INDEPENDENT_AMBULATORY_CARE_PROVIDER_SITE_OTHER): Payer: 59 | Admitting: Internal Medicine

## 2014-04-16 ENCOUNTER — Encounter: Payer: Self-pay | Admitting: Internal Medicine

## 2014-04-16 VITALS — BP 128/84 | HR 74 | Temp 98.3°F | Wt 297.0 lb

## 2014-04-16 DIAGNOSIS — F321 Major depressive disorder, single episode, moderate: Secondary | ICD-10-CM

## 2014-04-16 DIAGNOSIS — E669 Obesity, unspecified: Secondary | ICD-10-CM

## 2014-04-16 DIAGNOSIS — F418 Other specified anxiety disorders: Secondary | ICD-10-CM

## 2014-04-16 NOTE — Assessment & Plan Note (Signed)
Better on diet 

## 2014-04-16 NOTE — Progress Notes (Signed)
   Subjective:   HPI  F/u  depression - some better now; he is back working 2 jobs. Not taking his meds  F/u L knee pain over patella - tightness after playing a lot of basketball (2 times a day, stopped 2 wks ago due to pain) x 2 wks; no injury  F/u on premat ejaculation, anxiety, mild insomnia, dandruff. Doing better  BP Readings from Last 3 Encounters:  04/16/14 128/84  02/13/14 110/80  01/31/14 110/73   Wt Readings from Last 3 Encounters:  04/16/14 297 lb (134.718 kg)  02/13/14 313 lb (141.976 kg)  01/28/14 322 lb (146.058 kg)     Review of Systems  Constitutional: Negative for appetite change, fatigue and unexpected weight change.  HENT: Negative for congestion, nosebleeds, sneezing, sore throat and trouble swallowing.   Eyes: Negative for itching and visual disturbance.  Respiratory: Negative for cough.   Cardiovascular: Negative for chest pain, palpitations and leg swelling.  Gastrointestinal: Negative for nausea, diarrhea, blood in stool and abdominal distention.  Genitourinary: Negative for frequency and hematuria.  Musculoskeletal: Negative for back pain, joint swelling, gait problem and neck pain.  Skin: Negative for rash.  Neurological: Negative for dizziness, tremors, speech difficulty and weakness.  Psychiatric/Behavioral: Positive for sleep disturbance. Negative for suicidal ideas, confusion, dysphoric mood, decreased concentration and agitation. The patient is nervous/anxious.   not suicidal     Objective:   Physical Exam  Constitutional: He is oriented to person, place, and time. He appears well-developed. No distress.  NAD  HENT:  Mouth/Throat: Oropharynx is clear and moist.  Eyes: Conjunctivae are normal. Pupils are equal, round, and reactive to light.  Neck: Normal range of motion. No JVD present. No thyromegaly present.  Cardiovascular: Normal rate, regular rhythm, normal heart sounds and intact distal pulses.  Exam reveals no gallop and no friction  rub.   No murmur heard. Pulmonary/Chest: Effort normal and breath sounds normal. No respiratory distress. He has no wheezes. He has no rales. He exhibits no tenderness.  Abdominal: Soft. Bowel sounds are normal. He exhibits no distension and no mass. There is no tenderness. There is no rebound and no guarding.  Musculoskeletal: Normal range of motion. He exhibits no edema or tenderness.  Lymphadenopathy:    He has no cervical adenopathy.  Neurological: He is alert and oriented to person, place, and time. He has normal reflexes. No cranial nerve deficit. He exhibits normal muscle tone. He displays a negative Romberg sign. Coordination and gait normal.  Skin: Skin is warm and dry. No rash noted.  Psychiatric: His behavior is normal. Judgment and thought content normal.  Sad Obese  Lab Results  Component Value Date   WBC 6.3 01/28/2014   HGB 14.0 01/28/2014   HCT 42.7 01/28/2014   PLT 325 01/28/2014   GLUCOSE 100* 01/28/2014   CHOL 173 02/12/2013   TRIG 134.0 02/12/2013   HDL 65.10 02/12/2013   LDLCALC 81 02/12/2013   ALT 8 01/28/2014   AST 17 01/28/2014   NA 146 01/28/2014   K 4.0 01/28/2014   CL 106 01/28/2014   CREATININE 0.84 01/28/2014   BUN 11 01/28/2014   CO2 24 01/28/2014   TSH 0.861 01/29/2014          Assessment & Plan:

## 2014-04-16 NOTE — Progress Notes (Signed)
Pre visit review using our clinic review tool, if applicable. No additional management support is needed unless otherwise documented below in the visit note. 

## 2014-04-16 NOTE — Assessment & Plan Note (Signed)
Re-start Rx 

## 2014-04-16 NOTE — Assessment & Plan Note (Signed)
Not taking meds - refused Start counseling  Risks associated with treatment noncompliance were discussed.

## 2014-06-16 ENCOUNTER — Ambulatory Visit: Payer: Self-pay | Admitting: Internal Medicine

## 2014-06-26 ENCOUNTER — Ambulatory Visit: Payer: 59 | Admitting: Internal Medicine

## 2014-07-02 ENCOUNTER — Telehealth: Payer: Self-pay | Admitting: *Deleted

## 2014-07-02 ENCOUNTER — Other Ambulatory Visit (INDEPENDENT_AMBULATORY_CARE_PROVIDER_SITE_OTHER): Payer: 59

## 2014-07-02 ENCOUNTER — Other Ambulatory Visit: Payer: Self-pay | Admitting: *Deleted

## 2014-07-02 DIAGNOSIS — Z0000 Encounter for general adult medical examination without abnormal findings: Secondary | ICD-10-CM

## 2014-07-02 DIAGNOSIS — Z7721 Contact with and (suspected) exposure to potentially hazardous body fluids: Secondary | ICD-10-CM

## 2014-07-02 LAB — LIPID PANEL
Cholesterol: 150 mg/dL (ref 0–200)
HDL: 56.3 mg/dL (ref >39.00)
LDL CALC: 82 mg/dL (ref 0–99)
NONHDL: 93.7
Total CHOL/HDL Ratio: 3
Triglycerides: 58 mg/dL (ref 0.0–149.0)
VLDL: 11.6 mg/dL (ref 0.0–40.0)

## 2014-07-02 LAB — CBC WITH DIFFERENTIAL/PLATELET
Basophils Absolute: 0 10*3/uL (ref 0.0–0.1)
Basophils Relative: 0.4 % (ref 0.0–3.0)
Eosinophils Absolute: 0.1 10*3/uL (ref 0.0–0.7)
Eosinophils Relative: 1.3 % (ref 0.0–5.0)
HCT: 41.2 % (ref 39.0–52.0)
HEMOGLOBIN: 13.8 g/dL (ref 13.0–17.0)
LYMPHS ABS: 2.2 10*3/uL (ref 0.7–4.0)
Lymphocytes Relative: 37 % (ref 12.0–46.0)
MCHC: 33.5 g/dL (ref 30.0–36.0)
MCV: 87.8 fl (ref 78.0–100.0)
MONOS PCT: 8.9 % (ref 3.0–12.0)
Monocytes Absolute: 0.5 10*3/uL (ref 0.1–1.0)
NEUTROS ABS: 3 10*3/uL (ref 1.4–7.7)
Neutrophils Relative %: 52.4 % (ref 43.0–77.0)
Platelets: 317 10*3/uL (ref 150.0–400.0)
RBC: 4.69 Mil/uL (ref 4.22–5.81)
RDW: 14.1 % (ref 11.5–15.5)
WBC: 5.8 10*3/uL (ref 4.0–10.5)

## 2014-07-02 LAB — URINALYSIS, ROUTINE W REFLEX MICROSCOPIC
Bilirubin Urine: NEGATIVE
Ketones, ur: NEGATIVE
Leukocytes, UA: NEGATIVE
Nitrite: NEGATIVE
Specific Gravity, Urine: 1.02 (ref 1.000–1.030)
TOTAL PROTEIN, URINE-UPE24: NEGATIVE
URINE GLUCOSE: NEGATIVE
Urobilinogen, UA: 0.2 (ref 0.0–1.0)
pH: 7 (ref 5.0–8.0)

## 2014-07-02 LAB — BASIC METABOLIC PANEL
BUN: 15 mg/dL (ref 6–23)
CHLORIDE: 105 meq/L (ref 96–112)
CO2: 28 mEq/L (ref 19–32)
Calcium: 9.6 mg/dL (ref 8.4–10.5)
Creatinine, Ser: 1.01 mg/dL (ref 0.40–1.50)
GFR: 111.31 mL/min (ref >60.00)
GLUCOSE: 87 mg/dL (ref 70–99)
POTASSIUM: 4.2 meq/L (ref 3.5–5.1)
SODIUM: 139 meq/L (ref 135–145)

## 2014-07-02 LAB — RPR

## 2014-07-02 LAB — HEPATIC FUNCTION PANEL
ALBUMIN: 4.2 g/dL (ref 3.5–5.2)
ALT: 10 U/L (ref 0–53)
AST: 14 U/L (ref 0–37)
Alkaline Phosphatase: 45 U/L (ref 39–117)
BILIRUBIN TOTAL: 0.3 mg/dL (ref 0.2–1.2)
Bilirubin, Direct: 0.1 mg/dL (ref 0.0–0.3)
Total Protein: 7.2 g/dL (ref 6.0–8.3)

## 2014-07-02 LAB — HEPATITIS C ANTIBODY: HCV Ab: NEGATIVE

## 2014-07-02 LAB — TSH: TSH: 1.15 u[IU]/mL (ref 0.35–4.50)

## 2014-07-02 NOTE — Telephone Encounter (Signed)
Labs entered.

## 2014-07-02 NOTE — Telephone Encounter (Signed)
OK HIV, RPR, Hep C Ab Urine for chlamydia Thx

## 2014-07-02 NOTE — Telephone Encounter (Signed)
Pt came in to [facility] lab this morning. His labs have been drawn already. Now he is requesting HIV/STD tests be added. Please advise what to order.

## 2014-07-03 ENCOUNTER — Ambulatory Visit (INDEPENDENT_AMBULATORY_CARE_PROVIDER_SITE_OTHER): Payer: 59 | Admitting: Internal Medicine

## 2014-07-03 ENCOUNTER — Encounter: Payer: Self-pay | Admitting: Internal Medicine

## 2014-07-03 VITALS — BP 108/80 | HR 72 | Wt 280.0 lb

## 2014-07-03 DIAGNOSIS — E559 Vitamin D deficiency, unspecified: Secondary | ICD-10-CM | POA: Diagnosis not present

## 2014-07-03 DIAGNOSIS — F321 Major depressive disorder, single episode, moderate: Secondary | ICD-10-CM | POA: Diagnosis not present

## 2014-07-03 DIAGNOSIS — F418 Other specified anxiety disorders: Secondary | ICD-10-CM

## 2014-07-03 DIAGNOSIS — E669 Obesity, unspecified: Secondary | ICD-10-CM | POA: Diagnosis not present

## 2014-07-03 LAB — GC/CHLAMYDIA PROBE AMP, URINE
Chlamydia, Swab/Urine, PCR: NEGATIVE
GC PROBE AMP, URINE: NEGATIVE

## 2014-07-03 LAB — HIV ANTIBODY (ROUTINE TESTING W REFLEX): HIV 1&2 Ab, 4th Generation: NONREACTIVE

## 2014-07-03 NOTE — Assessment & Plan Note (Signed)
Wt Readings from Last 3 Encounters:  07/03/14 280 lb (127.007 kg)  04/16/14 297 lb (134.718 kg)  02/13/14 313 lb (141.976 kg)  Better

## 2014-07-03 NOTE — Progress Notes (Signed)
   Subjective:   HPI  Tommy [name] is here for a f/u He was concerned about STD; his GF had an STD in 2013  F/u  depression - some better now; he is back working 2 jobs. Not taking his meds  F/u L knee pain over patella - tightness after playing a lot of basketball (2 times a day, stopped 2 wks ago due to pain) x 2 wks; no injury  F/u on premat ejaculation, anxiety, mild insomnia, dandruff. Doing better  BP Readings from Last 3 Encounters:  07/03/14 108/80  04/16/14 128/84  02/13/14 110/80   Wt Readings from Last 3 Encounters:  07/03/14 280 lb (127.007 kg)  04/16/14 297 lb (134.718 kg)  02/13/14 313 lb (141.976 kg)     Review of Systems  Constitutional: Negative for appetite change, fatigue and unexpected weight change.  HENT: Negative for congestion, nosebleeds, sneezing, sore throat and trouble swallowing.   Eyes: Negative for itching and visual disturbance.  Respiratory: Negative for cough.   Cardiovascular: Negative for chest pain, palpitations and leg swelling.  Gastrointestinal: Negative for nausea, diarrhea, blood in stool and abdominal distention.  Genitourinary: Negative for frequency and hematuria.  Musculoskeletal: Negative for back pain, joint swelling, gait problem and neck pain.  Skin: Negative for rash.  Neurological: Negative for dizziness, tremors, speech difficulty and weakness.  Psychiatric/Behavioral: Positive for sleep disturbance. Negative for suicidal ideas, confusion, dysphoric mood, decreased concentration and agitation. The patient is nervous/anxious.   not suicidal     Objective:   Physical Exam  Constitutional: He is oriented to person, place, and time. He appears well-developed. No distress.  NAD  HENT:  Mouth/Throat: Oropharynx is clear and moist.  Eyes: Conjunctivae are normal. Pupils are equal, round, and reactive to light.  Neck: Normal range of motion. No JVD present. No thyromegaly present.  Cardiovascular: Normal rate, regular rhythm,  normal heart sounds and intact distal pulses.  Exam reveals no gallop and no friction rub.   No murmur heard. Pulmonary/Chest: Effort normal and breath sounds normal. No respiratory distress. He has no wheezes. He has no rales. He exhibits no tenderness.  Abdominal: Soft. Bowel sounds are normal. He exhibits no distension and no mass. There is no tenderness. There is no rebound and no guarding.  Musculoskeletal: Normal range of motion. He exhibits no edema or tenderness.  Lymphadenopathy:    He has no cervical adenopathy.  Neurological: He is alert and oriented to person, place, and time. He has normal reflexes. No cranial nerve deficit. He exhibits normal muscle tone. He displays a negative Romberg sign. Coordination and gait normal.  Skin: Skin is warm and dry. No rash noted.  Psychiatric: His behavior is normal. Judgment and thought content normal.  Sad Obese  Lab Results  Component Value Date   WBC 5.8 07/02/2014   HGB 13.8 07/02/2014   HCT 41.2 07/02/2014   PLT 317.0 07/02/2014   GLUCOSE 87 07/02/2014   CHOL 150 07/02/2014   TRIG 58.0 07/02/2014   HDL 56.30 07/02/2014   LDLCALC 82 07/02/2014   ALT 10 07/02/2014   AST 14 07/02/2014   NA 139 07/02/2014   K 4.2 07/02/2014   CL 105 07/02/2014   CREATININE 1.01 07/02/2014   BUN 15 07/02/2014   CO2 28 07/02/2014   TSH 1.15 07/02/2014    Safe sex discussed      Assessment & Plan:

## 2014-07-03 NOTE — Progress Notes (Signed)
Pre visit review using our clinic review tool, if applicable. No additional management support is needed unless otherwise documented below in the visit note. 

## 2014-07-03 NOTE — Assessment & Plan Note (Signed)
Doing better Paxil

## 2014-07-03 NOTE — Assessment & Plan Note (Signed)
Vit d po

## 2014-07-03 NOTE — Assessment & Plan Note (Signed)
On Paxil 

## 2014-08-15 ENCOUNTER — Other Ambulatory Visit: Payer: Self-pay | Admitting: Internal Medicine

## 2014-11-03 ENCOUNTER — Ambulatory Visit (INDEPENDENT_AMBULATORY_CARE_PROVIDER_SITE_OTHER): Payer: 59 | Admitting: Internal Medicine

## 2014-11-03 ENCOUNTER — Encounter: Payer: Self-pay | Admitting: Internal Medicine

## 2014-11-03 VITALS — BP 120/76 | HR 78 | Wt 303.0 lb

## 2014-11-03 DIAGNOSIS — F418 Other specified anxiety disorders: Secondary | ICD-10-CM | POA: Diagnosis not present

## 2014-11-03 DIAGNOSIS — E669 Obesity, unspecified: Secondary | ICD-10-CM

## 2014-11-03 DIAGNOSIS — E559 Vitamin D deficiency, unspecified: Secondary | ICD-10-CM

## 2014-11-03 MED ORDER — PAROXETINE HCL 40 MG PO TABS: 40.0000 mg | ORAL_TABLET | Freq: Every day | ORAL | Status: DC

## 2014-11-03 NOTE — Assessment & Plan Note (Signed)
On Vit D 

## 2014-11-03 NOTE — Assessment & Plan Note (Signed)
Low carb diet discussed 

## 2014-11-03 NOTE — Progress Notes (Signed)
Pre visit review using our clinic review tool, if applicable. No additional management support is needed unless otherwise documented below in the visit note. 

## 2014-11-03 NOTE — Assessment & Plan Note (Signed)
On Rx - Paroxetine

## 2014-11-03 NOTE — Progress Notes (Signed)
Subjective:  Patient ID: Tommy [name], male    DOB: Aug 07, 1985  Age: 29 y.o. MRN: [id number]  CC: No chief complaint on file.   HPI Tommy [name] presents for depression/anxiety. C/o wt gain  Outpatient Prescriptions Prior to Visit  Medication Sig Dispense Refill  . cholecalciferol (VITAMIN D) 1000 UNITS tablet Take 1 tablet (1,000 Units total) by mouth daily. 100 tablet 3  . naproxen (NAPROSYN) 500 MG tablet Take 1 tablet (500 mg total) by mouth 2 (two) times daily as needed for moderate pain (pc). 60 tablet 2  . PARoxetine (PAXIL) 40 MG tablet TAKE 1 TABLET BY MOUTH DAILY 90 tablet 3  . traZODone (DESYREL) 50 MG tablet Take 1 tablet (50 mg total) by mouth at bedtime and may repeat dose one time if needed. For sleep 60 tablet 0  . PARoxetine (PAXIL) 20 MG tablet Take 1 tablet (20 mg total) by mouth daily. For depression 30 tablet 5   No facility-administered medications prior to visit.    ROS Review of Systems  Constitutional: Negative for appetite change, fatigue and unexpected weight change.  HENT: Negative for congestion, nosebleeds, sneezing, sore throat and trouble swallowing.   Eyes: Negative for itching and visual disturbance.  Respiratory: Negative for cough.   Cardiovascular: Negative for chest pain, palpitations and leg swelling.  Gastrointestinal: Negative for nausea, diarrhea, blood in stool and abdominal distention.  Genitourinary: Negative for frequency and hematuria.  Musculoskeletal: Negative for back pain, joint swelling, gait problem and neck pain.  Skin: Negative for rash.  Neurological: Negative for dizziness, tremors, speech difficulty and weakness.  Psychiatric/Behavioral: Negative for suicidal ideas, sleep disturbance, dysphoric mood and agitation. The patient is not nervous/anxious.     Objective:  BP 120/76 mmHg  Pulse 78  Wt 303 lb (137.44 kg)  SpO2 98%  BP Readings from Last 3 Encounters:  11/03/14 120/76  07/03/14 108/80  04/16/14  128/84    Wt Readings from Last 3 Encounters:  11/03/14 303 lb (137.44 kg)  07/03/14 280 lb (127.007 kg)  04/16/14 297 lb (134.718 kg)    Physical Exam  Constitutional: He is oriented to person, place, and time. He appears well-developed. No distress.  NAD  HENT:  Mouth/Throat: Oropharynx is clear and moist.  Eyes: Conjunctivae are normal. Pupils are equal, round, and reactive to light.  Neck: Normal range of motion. No JVD present. No thyromegaly present.  Cardiovascular: Normal rate, regular rhythm, normal heart sounds and intact distal pulses.  Exam reveals no gallop and no friction rub.   No murmur heard. Pulmonary/Chest: Effort normal and breath sounds normal. No respiratory distress. He has no wheezes. He has no rales. He exhibits no tenderness.  Abdominal: Soft. Bowel sounds are normal. He exhibits no distension and no mass. There is no tenderness. There is no rebound and no guarding.  Musculoskeletal: Normal range of motion. He exhibits no edema or tenderness.  Lymphadenopathy:    He has no cervical adenopathy.  Neurological: He is alert and oriented to person, place, and time. He has normal reflexes. No cranial nerve deficit. He exhibits normal muscle tone. He displays a negative Romberg sign. Coordination and gait normal.  Skin: Skin is warm and dry. No rash noted.  Psychiatric: He has a normal mood and affect. His behavior is normal. Judgment and thought content normal.    Lab Results  Component Value Date   WBC 5.8 07/02/2014   HGB 13.8 07/02/2014   HCT 41.2 07/02/2014   PLT 317.0  07/02/2014   GLUCOSE 87 07/02/2014   CHOL 150 07/02/2014   TRIG 58.0 07/02/2014   HDL 56.30 07/02/2014   LDLCALC 82 07/02/2014   ALT 10 07/02/2014   AST 14 07/02/2014   NA 139 07/02/2014   K 4.2 07/02/2014   CL 105 07/02/2014   CREATININE 1.01 07/02/2014   BUN 15 07/02/2014   CO2 28 07/02/2014   TSH 1.15 07/02/2014    No results found.  Assessment & Plan:   There are no  diagnoses linked to this encounter. I am having Tommy [name] maintain his naproxen, traZODone, cholecalciferol, and PARoxetine.  No orders of the defined types were placed in this encounter.     Follow-up: No Follow-up on file.  Sonda Primes, MD

## 2014-11-03 NOTE — Patient Instructions (Signed)
Wt Readings from Last 3 Encounters:  11/03/14 303 lb (137.44 kg)  07/03/14 280 lb (127.007 kg)  04/16/14 297 lb (134.718 kg)   Low carb diet

## 2015-05-06 ENCOUNTER — Ambulatory Visit: Payer: Self-pay | Admitting: Internal Medicine

## 2015-07-31 ENCOUNTER — Encounter: Payer: Self-pay | Admitting: Internal Medicine

## 2015-07-31 ENCOUNTER — Ambulatory Visit (INDEPENDENT_AMBULATORY_CARE_PROVIDER_SITE_OTHER): Payer: 59 | Admitting: Internal Medicine

## 2015-07-31 VITALS — BP 110/64 | HR 85 | Wt 325.0 lb

## 2015-07-31 DIAGNOSIS — E669 Obesity, unspecified: Secondary | ICD-10-CM

## 2015-07-31 DIAGNOSIS — E559 Vitamin D deficiency, unspecified: Secondary | ICD-10-CM | POA: Diagnosis not present

## 2015-07-31 DIAGNOSIS — F321 Major depressive disorder, single episode, moderate: Secondary | ICD-10-CM

## 2015-07-31 DIAGNOSIS — F418 Other specified anxiety disorders: Secondary | ICD-10-CM

## 2015-07-31 MED ORDER — LORCASERIN HCL ER 20 MG PO TB24: 1.0000 | ORAL_TABLET | Freq: Every day | ORAL | Status: DC

## 2015-07-31 MED ORDER — PAROXETINE HCL 40 MG PO TABS: 40.0000 mg | ORAL_TABLET | Freq: Every day | ORAL | Status: DC

## 2015-07-31 MED ORDER — VITAMIN D3 50 MCG (2000 UT) PO CAPS: 2000.0000 [IU] | ORAL_CAPSULE | Freq: Every day | ORAL | Status: DC

## 2015-07-31 NOTE — Progress Notes (Signed)
Subjective:  Patient ID: Tommy [name], male    DOB: 11/29/1985  Age: 30 y.o. MRN: [id number]  CC: No chief complaint on file.   HPI Tommy GrainWilliam R [name] presents for anxiety  Outpatient Prescriptions Prior to Visit  Medication Sig Dispense Refill  . naproxen (NAPROSYN) 500 MG tablet Take 1 tablet (500 mg total) by mouth 2 (two) times daily as needed for moderate pain (pc). 60 tablet 2  . PARoxetine (PAXIL) 40 MG tablet Take 1 tablet (40 mg total) by mouth daily. 30 tablet 5   No facility-administered medications prior to visit.    ROS Review of Systems  Constitutional: Positive for unexpected weight change. Negative for appetite change and fatigue.  HENT: Negative for congestion, nosebleeds, sneezing, sore throat and trouble swallowing.   Eyes: Negative for itching and visual disturbance.  Respiratory: Negative for cough.   Cardiovascular: Negative for chest pain, palpitations and leg swelling.  Gastrointestinal: Negative for nausea, diarrhea, blood in stool and abdominal distention.  Genitourinary: Negative for frequency and hematuria.  Musculoskeletal: Negative for back pain, joint swelling, gait problem and neck pain.  Skin: Negative for rash.  Neurological: Negative for dizziness, tremors, speech difficulty and weakness.  Psychiatric/Behavioral: Negative for sleep disturbance, dysphoric mood and agitation. The patient is nervous/anxious.     Objective:  BP 110/64 mmHg  Pulse 85  Wt 325 lb (147.419 kg)  SpO2 95%  BP Readings from Last 3 Encounters:  07/31/15 110/64  11/03/14 120/76  07/03/14 108/80    Wt Readings from Last 3 Encounters:  07/31/15 325 lb (147.419 kg)  11/03/14 303 lb (137.44 kg)  07/03/14 280 lb (127.007 kg)    Physical Exam  Constitutional: He is oriented to person, place, and time. He appears well-developed. No distress.  NAD  HENT:  Mouth/Throat: Oropharynx is clear and moist.  Eyes: Conjunctivae are normal. Pupils are equal, round, and  reactive to light.  Neck: Normal range of motion. No JVD present. No thyromegaly present.  Cardiovascular: Normal rate, regular rhythm, normal heart sounds and intact distal pulses.  Exam reveals no gallop and no friction rub.   No murmur heard. Pulmonary/Chest: Effort normal and breath sounds normal. No respiratory distress. He has no wheezes. He has no rales. He exhibits no tenderness.  Abdominal: Soft. Bowel sounds are normal. He exhibits no distension and no mass. There is no tenderness. There is no rebound and no guarding.  Musculoskeletal: Normal range of motion. He exhibits no edema or tenderness.  Lymphadenopathy:    He has no cervical adenopathy.  Neurological: He is alert and oriented to person, place, and time. He has normal reflexes. No cranial nerve deficit. He exhibits normal muscle tone. He displays a negative Romberg sign. Coordination and gait normal.  Skin: Skin is warm and dry. No rash noted.  Psychiatric: He has a normal mood and affect. His behavior is normal. Judgment and thought content normal.  Obese  Lab Results  Component Value Date   WBC 5.8 07/02/2014   HGB 13.8 07/02/2014   HCT 41.2 07/02/2014   PLT 317.0 07/02/2014   GLUCOSE 87 07/02/2014   CHOL 150 07/02/2014   TRIG 58.0 07/02/2014   HDL 56.30 07/02/2014   LDLCALC 82 07/02/2014   ALT 10 07/02/2014   AST 14 07/02/2014   NA 139 07/02/2014   K 4.2 07/02/2014   CL 105 07/02/2014   CREATININE 1.01 07/02/2014   BUN 15 07/02/2014   CO2 28 07/02/2014   TSH 1.15 07/02/2014  No results found.  Assessment & Plan:   Diagnoses and all orders for this visit:  Situational anxiety -     PARoxetine (PAXIL) 40 MG tablet; Take 1 tablet (40 mg total) by mouth daily.   I am having Tommy [name] maintain his naproxen and PARoxetine.  No orders of the defined types were placed in this encounter.     Follow-up: No Follow-up on file.  Sonda Primes, MD

## 2015-07-31 NOTE — Assessment & Plan Note (Signed)
Use Vit D 

## 2015-07-31 NOTE — Patient Instructions (Signed)
Lap Band surgery

## 2015-07-31 NOTE — Progress Notes (Signed)
Pre visit review using our clinic review tool, if applicable. No additional management support is needed unless otherwise documented below in the visit note. 

## 2015-07-31 NOTE — Assessment & Plan Note (Signed)
Options discussed Try Belviq XR

## 2015-07-31 NOTE — Assessment & Plan Note (Signed)
On Paxil 

## 2015-09-04 ENCOUNTER — Other Ambulatory Visit: Payer: Self-pay | Admitting: *Deleted

## 2015-09-04 DIAGNOSIS — F418 Other specified anxiety disorders: Secondary | ICD-10-CM

## 2015-09-04 MED ORDER — PAROXETINE HCL 40 MG PO TABS: 40.0000 mg | ORAL_TABLET | Freq: Every day | ORAL | Status: DC

## 2016-01-29 ENCOUNTER — Ambulatory Visit (INDEPENDENT_AMBULATORY_CARE_PROVIDER_SITE_OTHER): Payer: 59 | Admitting: Internal Medicine

## 2016-01-29 ENCOUNTER — Encounter: Payer: Self-pay | Admitting: Internal Medicine

## 2016-01-29 DIAGNOSIS — E6609 Other obesity due to excess calories: Secondary | ICD-10-CM | POA: Diagnosis not present

## 2016-01-29 DIAGNOSIS — IMO0001 Reserved for inherently not codable concepts without codable children: Secondary | ICD-10-CM

## 2016-01-29 DIAGNOSIS — Z23 Encounter for immunization: Secondary | ICD-10-CM | POA: Diagnosis not present

## 2016-01-29 DIAGNOSIS — Z6841 Body Mass Index (BMI) 40.0 and over, adult: Secondary | ICD-10-CM

## 2016-01-29 DIAGNOSIS — F418 Other specified anxiety disorders: Secondary | ICD-10-CM

## 2016-01-29 DIAGNOSIS — F40241 Acrophobia: Secondary | ICD-10-CM

## 2016-01-29 MED ORDER — PAROXETINE HCL 40 MG PO TABS: 40.0000 mg | ORAL_TABLET | Freq: Every day | ORAL | 3 refills | Status: DC

## 2016-01-29 NOTE — Progress Notes (Signed)
Subjective:  Patient ID: Tommy [name], male    DOB: 1985/07/28  Age: 30 y.o. MRN: [id number]  CC: No chief complaint on file.   HPI Tommy GrainWilliam R Zaccaro presents for anxiety The pt fell off a zip line in [place] - harness snapped and he fell in the bushes in June 2017: he was checked at the hospital. He is anxious to be on hight now at work.  Outpatient Medications Prior to Visit  Medication Sig Dispense Refill  . Cholecalciferol (VITAMIN D3) 2000 units capsule Take 1 capsule (2,000 Units total) by mouth daily. 100 capsule 3  . Lorcaserin HCl ER (BELVIQ XR) 20 MG TB24 Take 1 tablet by mouth daily. 30 tablet 3  . PARoxetine (PAXIL) 40 MG tablet Take 1 tablet (40 mg total) by mouth daily. 90 tablet 3   No facility-administered medications prior to visit.     ROS Review of Systems  Constitutional: Negative for appetite change, fatigue and unexpected weight change.  HENT: Negative for congestion, nosebleeds, sneezing, sore throat and trouble swallowing.   Eyes: Negative for itching and visual disturbance.  Respiratory: Negative for cough.   Cardiovascular: Negative for chest pain, palpitations and leg swelling.  Gastrointestinal: Negative for abdominal distention, blood in stool, diarrhea and nausea.  Genitourinary: Negative for frequency and hematuria.  Musculoskeletal: Negative for back pain, gait problem, joint swelling and neck pain.  Skin: Negative for rash.  Neurological: Negative for dizziness, tremors, speech difficulty and weakness.  Psychiatric/Behavioral: Negative for agitation, dysphoric mood and sleep disturbance. The patient is nervous/anxious.     Objective:  BP 114/70   Pulse 78   Temp 98.4 F (36.9 C)   Wt (!) 334 lb (151.5 kg)   SpO2 98%   BMI 46.58 kg/m   BP Readings from Last 3 Encounters:  01/29/16 114/70  07/31/15 110/64  11/03/14 120/76    Wt Readings from Last 3 Encounters:  01/29/16 (!) 334 lb (151.5 kg)  07/31/15 (!) 325 lb (147.4 kg)    11/03/14 (!) 303 lb (137.4 kg)    Physical Exam  Constitutional: He is oriented to person, place, and time. He appears well-developed. No distress.  NAD  HENT:  Mouth/Throat: Oropharynx is clear and moist.  Eyes: Conjunctivae are normal. Pupils are equal, round, and reactive to light.  Neck: Normal range of motion. No JVD present. No thyromegaly present.  Cardiovascular: Normal rate, regular rhythm, normal heart sounds and intact distal pulses.  Exam reveals no gallop and no friction rub.   No murmur heard. Pulmonary/Chest: Effort normal and breath sounds normal. No respiratory distress. He has no wheezes. He has no rales. He exhibits no tenderness.  Abdominal: Soft. Bowel sounds are normal. He exhibits no distension and no mass. There is no tenderness. There is no rebound and no guarding.  Musculoskeletal: Normal range of motion. He exhibits no edema or tenderness.  Lymphadenopathy:    He has no cervical adenopathy.  Neurological: He is alert and oriented to person, place, and time. He has normal reflexes. No cranial nerve deficit. He exhibits normal muscle tone. He displays a negative Romberg sign. Coordination and gait normal.  Skin: Skin is warm and dry. No rash noted.  Psychiatric: He has a normal mood and affect. His behavior is normal. Judgment and thought content normal.  Obese   Lab Results  Component Value Date   WBC 5.8 07/02/2014   HGB 13.8 07/02/2014   HCT 41.2 07/02/2014   PLT 317.0 07/02/2014   GLUCOSE  87 07/02/2014   CHOL 150 07/02/2014   TRIG 58.0 07/02/2014   HDL 56.30 07/02/2014   LDLCALC 82 07/02/2014   ALT 10 07/02/2014   AST 14 07/02/2014   NA 139 07/02/2014   K 4.2 07/02/2014   CL 105 07/02/2014   CREATININE 1.01 07/02/2014   BUN 15 07/02/2014   CO2 28 07/02/2014   TSH 1.15 07/02/2014    No results found.  Assessment & Plan:   There are no diagnoses linked to this encounter. I am having Mr. Claudette LawsWatson maintain his Lorcaserin HCl ER, Vitamin D3,  and PARoxetine.  No orders of the defined types were placed in this encounter.    Follow-up: No Follow-up on file.  Sonda PrimesAlex Leanna Hamid, MD

## 2016-01-29 NOTE — Assessment & Plan Note (Signed)
The pt fell off a zip line in [place] - harness snapped and he fell in the bushes in June 2017: he was checked at the hospital. He is anxious to be on hight now at work. Discussed Letter for work

## 2016-01-29 NOTE — Assessment & Plan Note (Signed)
Discussed.

## 2016-01-29 NOTE — Progress Notes (Signed)
Pre visit review using our clinic review tool, if applicable. No additional management support is needed unless otherwise documented below in the visit note. 

## 2016-01-29 NOTE — Patient Instructions (Signed)
Paleo diet Low carb diet

## 2016-01-29 NOTE — Assessment & Plan Note (Signed)
Paleo diet option was discussed RTC 6 mo

## 2016-01-29 NOTE — Addendum Note (Signed)
Addended by: Merrilyn PumaSIMMONS, STACEY N on: 01/29/2016 10:44 AM   Modules accepted: Orders

## 2016-07-04 ENCOUNTER — Telehealth: Payer: Self-pay | Admitting: Internal Medicine

## 2016-07-04 NOTE — Telephone Encounter (Signed)
Pt would like refiill of PARoxetine (PAXIL) 40 MG tablet    [facility]

## 2016-07-04 NOTE — Telephone Encounter (Signed)
#  90 with 3 refills was sent in 01/2016, pt notified to contact pharmacy

## 2016-07-29 ENCOUNTER — Encounter: Payer: Self-pay | Admitting: Internal Medicine

## 2016-08-02 ENCOUNTER — Other Ambulatory Visit (INDEPENDENT_AMBULATORY_CARE_PROVIDER_SITE_OTHER): Payer: 59

## 2016-08-02 ENCOUNTER — Ambulatory Visit (INDEPENDENT_AMBULATORY_CARE_PROVIDER_SITE_OTHER): Payer: 59 | Admitting: Internal Medicine

## 2016-08-02 ENCOUNTER — Encounter: Payer: Self-pay | Admitting: Internal Medicine

## 2016-08-02 VITALS — BP 122/78 | HR 85 | Temp 98.2°F | Ht 71.0 in | Wt 348.0 lb

## 2016-08-02 DIAGNOSIS — F321 Major depressive disorder, single episode, moderate: Secondary | ICD-10-CM

## 2016-08-02 DIAGNOSIS — Z6841 Body Mass Index (BMI) 40.0 and over, adult: Secondary | ICD-10-CM

## 2016-08-02 DIAGNOSIS — R3129 Other microscopic hematuria: Secondary | ICD-10-CM

## 2016-08-02 DIAGNOSIS — Z0000 Encounter for general adult medical examination without abnormal findings: Secondary | ICD-10-CM

## 2016-08-02 DIAGNOSIS — E559 Vitamin D deficiency, unspecified: Secondary | ICD-10-CM | POA: Diagnosis not present

## 2016-08-02 DIAGNOSIS — F418 Other specified anxiety disorders: Secondary | ICD-10-CM

## 2016-08-02 LAB — CBC WITH DIFFERENTIAL/PLATELET
Basophils Absolute: 0 10*3/uL (ref 0.0–0.1)
Basophils Relative: 0.9 % (ref 0.0–3.0)
EOS PCT: 2.3 % (ref 0.0–5.0)
Eosinophils Absolute: 0.1 10*3/uL (ref 0.0–0.7)
HCT: 42.6 % (ref 39.0–52.0)
Hemoglobin: 14.1 g/dL (ref 13.0–17.0)
LYMPHS ABS: 2 10*3/uL (ref 0.7–4.0)
Lymphocytes Relative: 39.3 % (ref 12.0–46.0)
MCHC: 33.1 g/dL (ref 30.0–36.0)
MCV: 89.5 fl (ref 78.0–100.0)
Monocytes Absolute: 0.5 10*3/uL (ref 0.1–1.0)
Monocytes Relative: 10.3 % (ref 3.0–12.0)
NEUTROS ABS: 2.4 10*3/uL (ref 1.4–7.7)
NEUTROS PCT: 47.2 % (ref 43.0–77.0)
Platelets: 294 10*3/uL (ref 150.0–400.0)
RBC: 4.77 Mil/uL (ref 4.22–5.81)
RDW: 13.6 % (ref 11.5–15.5)
WBC: 5 10*3/uL (ref 4.0–10.5)

## 2016-08-02 LAB — URINALYSIS, ROUTINE W REFLEX MICROSCOPIC
BILIRUBIN URINE: NEGATIVE
Ketones, ur: NEGATIVE
LEUKOCYTES UA: NEGATIVE
NITRITE: NEGATIVE
Specific Gravity, Urine: >=1.03 — AB (ref 1.000–1.030)
TOTAL PROTEIN, URINE-UPE24: 30 — AB
Urine Glucose: NEGATIVE
Urobilinogen, UA: 0.2 (ref 0.0–1.0)
pH: 6 (ref 5.0–8.0)

## 2016-08-02 LAB — BASIC METABOLIC PANEL
BUN: 18 mg/dL (ref 6–23)
CHLORIDE: 105 meq/L (ref 96–112)
CO2: 28 mEq/L (ref 19–32)
CREATININE: 0.9 mg/dL (ref 0.40–1.50)
Calcium: 9.7 mg/dL (ref 8.4–10.5)
GFR: 125.45 mL/min (ref >60.00)
GLUCOSE: 94 mg/dL (ref 70–99)
Potassium: 4.3 mEq/L (ref 3.5–5.1)
Sodium: 138 mEq/L (ref 135–145)

## 2016-08-02 LAB — HEPATIC FUNCTION PANEL
ALT: 31 U/L (ref 0–53)
AST: 21 U/L (ref 0–37)
Albumin: 4.4 g/dL (ref 3.5–5.2)
Alkaline Phosphatase: 47 U/L (ref 39–117)
Bilirubin, Direct: 0.2 mg/dL (ref 0.0–0.3)
Total Bilirubin: 0.4 mg/dL (ref 0.2–1.2)
Total Protein: 7.4 g/dL (ref 6.0–8.3)

## 2016-08-02 LAB — LIPID PANEL
CHOLESTEROL: 154 mg/dL (ref 0–200)
HDL: 50 mg/dL (ref >39.00)
LDL Cholesterol: 81 mg/dL (ref 0–99)
NonHDL: 104.41
TRIGLYCERIDES: 116 mg/dL (ref 0.0–149.0)
Total CHOL/HDL Ratio: 3
VLDL: 23.2 mg/dL (ref 0.0–40.0)

## 2016-08-02 LAB — TSH: TSH: 1.75 u[IU]/mL (ref 0.35–4.50)

## 2016-08-02 MED ORDER — PAROXETINE HCL 40 MG PO TABS: 40.0000 mg | ORAL_TABLET | Freq: Every day | ORAL | 2 refills | Status: DC

## 2016-08-02 MED ORDER — B COMPLEX PO TABS: 1.0000 | ORAL_TABLET | Freq: Every day | ORAL | 3 refills | Status: DC

## 2016-08-02 NOTE — Assessment & Plan Note (Signed)
We discussed age appropriate health related issues, including available/recomended screening tests and vaccinations. We discussed a need for adhering to healthy diet and exercise. Labs were ordered to be later reviewed . All questions were answered.   

## 2016-08-02 NOTE — Progress Notes (Signed)
Subjective:  Patient ID: Tommy [name], male    DOB: 06/10/1985  Age: 31 y.o. MRN: [id number]  CC: No chief complaint on file.   HPI Tommy GrainWilliam R [name] presents for a well exam   Outpatient Medications Prior to Visit  Medication Sig Dispense Refill  . PARoxetine (PAXIL) 40 MG tablet Take 1 tablet (40 mg total) by mouth daily. 90 tablet 3  . Cholecalciferol (VITAMIN D3) 2000 units capsule Take 1 capsule (2,000 Units total) by mouth daily. 100 capsule 3  . Lorcaserin HCl ER (BELVIQ XR) 20 MG TB24 Take 1 tablet by mouth daily. 30 tablet 3   No facility-administered medications prior to visit.     ROS Review of Systems  Constitutional: Positive for unexpected weight change. Negative for appetite change and fatigue.  HENT: Negative for congestion, nosebleeds, sneezing, sore throat and trouble swallowing.   Eyes: Negative for itching and visual disturbance.  Respiratory: Negative for cough.   Cardiovascular: Negative for chest pain, palpitations and leg swelling.  Gastrointestinal: Negative for abdominal distention, blood in stool, diarrhea and nausea.  Genitourinary: Negative for frequency and hematuria.  Musculoskeletal: Negative for back pain, gait problem, joint swelling and neck pain.  Skin: Negative for rash.  Neurological: Negative for dizziness, tremors, speech difficulty and weakness.  Psychiatric/Behavioral: Negative for agitation, dysphoric mood, sleep disturbance and suicidal ideas. The patient is not nervous/anxious.     Objective:  BP 122/78 (BP Location: Left Arm, Patient Position: Sitting, Cuff Size: Large)   Pulse 85   Temp 98.2 F (36.8 C) (Oral)   Ht 5\' 11"  (1.803 m)   Wt (!) 348 lb (157.9 kg)   SpO2 98%   BMI 48.54 kg/m   BP Readings from Last 3 Encounters:  08/02/16 122/78  01/29/16 114/70  07/31/15 110/64    Wt Readings from Last 3 Encounters:  08/02/16 (!) 348 lb (157.9 kg)  01/29/16 (!) 334 lb (151.5 kg)  07/31/15 (!) 325 lb (147.4 kg)     Physical Exam  Constitutional: He is oriented to person, place, and time. He appears well-developed. No distress.  NAD  HENT:  Mouth/Throat: Oropharynx is clear and moist.  Eyes: Conjunctivae are normal. Pupils are equal, round, and reactive to light.  Neck: Normal range of motion. No JVD present. No thyromegaly present.  Cardiovascular: Normal rate, regular rhythm, normal heart sounds and intact distal pulses.  Exam reveals no gallop and no friction rub.   No murmur heard. Pulmonary/Chest: Effort normal and breath sounds normal. No respiratory distress. He has no wheezes. He has no rales. He exhibits no tenderness.  Abdominal: Soft. Bowel sounds are normal. He exhibits no distension and no mass. There is no tenderness. There is no rebound and no guarding.  Musculoskeletal: Normal range of motion. He exhibits no edema or tenderness.  Lymphadenopathy:    He has no cervical adenopathy.  Neurological: He is alert and oriented to person, place, and time. He has normal reflexes. No cranial nerve deficit. He exhibits normal muscle tone. He displays a negative Romberg sign. Coordination and gait normal.  Skin: Skin is warm and dry. No rash noted.  Psychiatric: He has a normal mood and affect. His behavior is normal. Judgment and thought content normal.  obese  Lab Results  Component Value Date   WBC 5.8 07/02/2014   HGB 13.8 07/02/2014   HCT 41.2 07/02/2014   PLT 317.0 07/02/2014   GLUCOSE 87 07/02/2014   CHOL 150 07/02/2014   TRIG 58.0 07/02/2014  HDL 56.30 07/02/2014   LDLCALC 82 07/02/2014   ALT 10 07/02/2014   AST 14 07/02/2014   NA 139 07/02/2014   K 4.2 07/02/2014   CL 105 07/02/2014   CREATININE 1.01 07/02/2014   BUN 15 07/02/2014   CO2 28 07/02/2014   TSH 1.15 07/02/2014    No results found.  Assessment & Plan:   There are no diagnoses linked to this encounter. I have discontinued Mr. Vandam's Lorcaserin HCl ER and Vitamin D3. I am also having him maintain his  PARoxetine and cholecalciferol.  Meds ordered this encounter  Medications  . cholecalciferol (VITAMIN D) 1000 units tablet    Sig: Take 2,000 Units by mouth daily.     Follow-up: No Follow-up on file.  Sonda Primes, MD

## 2016-08-02 NOTE — Assessment & Plan Note (Signed)
Paxil 

## 2016-08-02 NOTE — Patient Instructions (Addendum)
You can see Dr Quillian Quincearen Beasley for weight loss

## 2016-08-02 NOTE — Assessment & Plan Note (Signed)
Vit D 

## 2016-08-02 NOTE — Assessment & Plan Note (Addendum)
Advised to see Dr Beasley 

## 2016-08-03 DIAGNOSIS — R3129 Other microscopic hematuria: Secondary | ICD-10-CM | POA: Insufficient documentation

## 2016-08-03 NOTE — Assessment & Plan Note (Signed)
He had this issue a few years back with a negative urologic w/up

## 2016-11-11 ENCOUNTER — Ambulatory Visit (INDEPENDENT_AMBULATORY_CARE_PROVIDER_SITE_OTHER): Payer: 59 | Admitting: Family Medicine

## 2016-11-11 ENCOUNTER — Encounter: Payer: Self-pay | Admitting: Family Medicine

## 2016-11-11 VITALS — BP 118/78 | HR 88 | Temp 98.4°F | Ht 71.0 in | Wt 344.0 lb

## 2016-11-11 DIAGNOSIS — M545 Low back pain, unspecified: Secondary | ICD-10-CM | POA: Insufficient documentation

## 2016-11-11 MED ORDER — MELOXICAM 15 MG PO TABS: 15.0000 mg | ORAL_TABLET | Freq: Every day | ORAL | 1 refills | Status: DC | PRN

## 2016-11-11 MED ORDER — KETOROLAC TROMETHAMINE 60 MG/2ML IM SOLN
60.0000 mg | Freq: Once | INTRAMUSCULAR | Status: AC
  Administered 2016-11-11: 60 mg via INTRAMUSCULAR

## 2016-11-11 MED ORDER — CYCLOBENZAPRINE HCL 5 MG PO TABS
5.0000 mg | ORAL_TABLET | Freq: Three times a day (TID) | ORAL | 1 refills | Status: DC | PRN
Start: 2016-11-11 — End: 2016-12-23

## 2016-11-11 NOTE — Assessment & Plan Note (Signed)
Most likely a muscle strain in nature. Does not appear to be a nerve compression with no radicular symptoms. Does not appear to be infectious. - Toradol injection today -Mobic and Flexeril to use as needed - Home exercises provided. -If no improvement then can consider physical therapy - Consider imaging at follow-up.

## 2016-11-11 NOTE — Progress Notes (Signed)
Tommy [name] - 31 y.o. male MRN [id number]  Date of birth: 05-Apr-1985  SUBJECTIVE:  Including CC & ROS.  Chief Complaint  Patient presents with  . Back Pain    tuesday patient states he had a really sharp pain in lower back to where he could not walk, turn, laugh, cough. pain went away then the pain came back and has not been to either of his jobs in two days   Tommy [name] is a 31 year old male that is presenting with acute right-sided low back pain. He reports no inciting event. He has taken anti-inflammatories for the pain. The pain has been as bad as 9 out of 10. The pain is worse with twisting and bending over. He is unable to tie his shoes this morning as the pain was so bad. He denies any radicular symptoms. He has never had pain like this before. The pain started on Monday and has been intermittent this week. The pain is sharp in nature.   No imaging to review. Review of his blood work from 08/02/16 shows normal kidney function.  Review of Systems  Constitutional: Negative for fever.  Musculoskeletal: Positive for back pain. Negative for arthralgias and gait problem.  Neurological: Negative for weakness and numbness.  Hematological: Negative for adenopathy.   otherwise negative  HISTORY: Past Medical, Surgical, Social, and Family History Reviewed & Updated per EMR.   Pertinent Historical Findings include:  Past Medical History:  Diagnosis Date  . ALLERGIC RHINITIS 08/25/2009   Qualifier: Diagnosis of  By: Plotnikov MD, Georgina Quint   . Anxiety   . HAND PAIN 08/25/2009   Qualifier: Diagnosis of  By: Plotnikov MD, Georgina Quint     History reviewed. No pertinent surgical history.  No Known Allergies  Family History  Problem Relation Age of Onset  . Arthritis Mother      Social History   Social History  . Marital status: Single    Spouse name: N/A  . Number of children: N/A  . Years of education: N/A   Occupational History  .  [employer]   Social History Main  Topics  . Smoking status: Former Games developer  . Smokeless tobacco: Never Used  . Alcohol use Yes     Comment: occ  . Drug use: No  . Sexual activity: Yes    Birth control/ protection: Condom   Other Topics Concern  . Not on file   Social History Narrative  . No narrative on file     PHYSICAL EXAM:  VS: BP 118/78 (BP Location: Left Arm, Patient Position: Sitting, Cuff Size: Large)   Pulse 88   Temp 98.4 F (36.9 C) (Oral)   Ht 5\' 11"  (1.803 m)   Wt (!) 344 lb (156 kg)   SpO2 99%   BMI 47.98 kg/m  Physical Exam Gen: NAD, alert, cooperative with exam, well-appearing ENT: normal lips, normal nasal mucosa,  Eye: normal EOM, normal conjunctiva and lids CV:  no edema, +2 pedal pulses   Resp: no accessory muscle use, non-labored,  Skin: no rashes, no areas of induration  Neuro: normal tone, normal sensation to touch Psych:  normal insight, alert and oriented MSK:  Back Exam:  Inspection: Unremarkable  Palpable tenderness: right lower back Tenderness  No tenderness to palpation of the greater trochanter, piriformis, or SI joint Normal internal and external rotation of the hips bilaterally Leg strength: Quad: 5/5 Hamstring: 5/5 Hip flexor: 5/5  Sensory change: Gross sensation intact to all lumbar and  sacral dermatomes.  Gait unremarkable. SLR laying: Negative  XSLR laying: Negative  Neurovascularly intact.       ASSESSMENT & PLAN:   Low back pain Most likely a muscle strain in nature. Does not appear to be a nerve compression with no radicular symptoms. Does not appear to be infectious. - Toradol injection today -Mobic and Flexeril to use as needed - Home exercises provided. -If no improvement then can consider physical therapy - Consider imaging at follow-up.

## 2016-11-11 NOTE — Patient Instructions (Addendum)
Thank you for coming in,   The muscle relaxant can make you drowsy. Please make sure he makes you feel before you take it before work. We started do the exercises on a regular basis. Please follow-up with me in 2-3 weeks if your symptoms do not improve.   Please feel free to call with any questions or concerns at any time, at [phone]. --Dr. Jordan LikesSchmitz

## 2016-12-23 ENCOUNTER — Other Ambulatory Visit: Payer: Self-pay | Admitting: Family Medicine

## 2017-01-30 ENCOUNTER — Other Ambulatory Visit: Payer: Self-pay | Admitting: Family Medicine

## 2017-02-06 ENCOUNTER — Encounter: Payer: Self-pay | Admitting: Internal Medicine

## 2017-02-06 ENCOUNTER — Ambulatory Visit (INDEPENDENT_AMBULATORY_CARE_PROVIDER_SITE_OTHER): Payer: 59 | Admitting: Internal Medicine

## 2017-02-06 ENCOUNTER — Other Ambulatory Visit (INDEPENDENT_AMBULATORY_CARE_PROVIDER_SITE_OTHER): Payer: 59

## 2017-02-06 VITALS — BP 116/82 | HR 75 | Temp 98.2°F | Ht 71.0 in | Wt 356.0 lb

## 2017-02-06 DIAGNOSIS — F418 Other specified anxiety disorders: Secondary | ICD-10-CM | POA: Diagnosis not present

## 2017-02-06 DIAGNOSIS — G8929 Other chronic pain: Secondary | ICD-10-CM | POA: Diagnosis not present

## 2017-02-06 DIAGNOSIS — M25522 Pain in left elbow: Secondary | ICD-10-CM

## 2017-02-06 DIAGNOSIS — Z6841 Body Mass Index (BMI) 40.0 and over, adult: Secondary | ICD-10-CM | POA: Diagnosis not present

## 2017-02-06 DIAGNOSIS — E559 Vitamin D deficiency, unspecified: Secondary | ICD-10-CM | POA: Diagnosis not present

## 2017-02-06 DIAGNOSIS — Z23 Encounter for immunization: Secondary | ICD-10-CM

## 2017-02-06 LAB — BASIC METABOLIC PANEL
BUN: 17 mg/dL (ref 6–23)
CHLORIDE: 104 meq/L (ref 96–112)
CO2: 26 mEq/L (ref 19–32)
Calcium: 9.8 mg/dL (ref 8.4–10.5)
Creatinine, Ser: 0.94 mg/dL (ref 0.40–1.50)
GFR: 118.93 mL/min (ref >60.00)
GLUCOSE: 101 mg/dL — AB (ref 70–99)
POTASSIUM: 4.4 meq/L (ref 3.5–5.1)
SODIUM: 139 meq/L (ref 135–145)

## 2017-02-06 LAB — TSH: TSH: 2.87 u[IU]/mL (ref 0.35–4.50)

## 2017-02-06 MED ORDER — PAROXETINE HCL 40 MG PO TABS: 40.0000 mg | ORAL_TABLET | Freq: Every day | ORAL | 3 refills | Status: DC

## 2017-02-06 MED ORDER — MELOXICAM 15 MG PO TABS
15.0000 mg | ORAL_TABLET | Freq: Every day | ORAL | 0 refills | Status: DC
Start: 2017-02-06 — End: 2017-02-14

## 2017-02-06 NOTE — Assessment & Plan Note (Addendum)
Wt Readings from Last 3 Encounters:  02/06/17 (!) 356 lb (161.5 kg)  11/11/16 (!) 344 lb (156 kg)  08/02/16 (!) 348 lb (157.9 kg)   Advised to see Dr Dalbert GarnetBeasley - he will think about it

## 2017-02-06 NOTE — Assessment & Plan Note (Addendum)
Lat>medial pain Meloxicam and ice Sports med ref if not better

## 2017-02-06 NOTE — Assessment & Plan Note (Signed)
On vit D 

## 2017-02-06 NOTE — Progress Notes (Signed)
Subjective:  Patient ID: Tommy [name], male    DOB: October 29, 1985  Age: 31 y.o. MRN: [id number]  CC: No chief complaint on file.   HPI Tommy [name] presents for anxiety, obesity f/u C/o L elbow pain lat>medial x2 mo. Lifting boxes at work...  Outpatient Medications Prior to Visit  Medication Sig Dispense Refill  . b complex vitamins tablet Take 1 tablet by mouth daily. 100 tablet 3  . cholecalciferol (VITAMIN D) 1000 units tablet Take 2,000 Units by mouth daily.    Marland Kitchen PARoxetine (PAXIL) 40 MG tablet Take 1 tablet (40 mg total) by mouth daily. 90 tablet 2  . cyclobenzaprine (FLEXERIL) 5 MG tablet TAKE 1 TABLET BY MOUTH 3  TIMES DAILY AS NEEDED FOR  MUSCLE SPASM(S) 30 tablet 0  . meloxicam (MOBIC) 15 MG tablet TAKE 1 TABLET BY MOUTH  DAILY AS NEEDED FOR PAIN 60 tablet 0   No facility-administered medications prior to visit.     ROS Review of Systems  Constitutional: Negative for appetite change, fatigue and unexpected weight change.  HENT: Negative for congestion, nosebleeds, sneezing, sore throat and trouble swallowing.   Eyes: Negative for itching and visual disturbance.  Respiratory: Negative for cough.   Cardiovascular: Negative for chest pain, palpitations and leg swelling.  Gastrointestinal: Negative for abdominal distention, blood in stool, diarrhea and nausea.  Genitourinary: Negative for frequency and hematuria.  Musculoskeletal: Negative for back pain, gait problem, joint swelling and neck pain.  Skin: Negative for rash.  Neurological: Negative for dizziness, tremors, speech difficulty and weakness.  Psychiatric/Behavioral: Negative for agitation, dysphoric mood and sleep disturbance. The patient is nervous/anxious.     Objective:  BP 116/82 (BP Location: Left Arm, Patient Position: Sitting, Cuff Size: Large)   Pulse 75   Temp 98.2 F (36.8 C) (Oral)   Ht  (1.803 m)   Wt (!) 356 lb (161.5 kg)   SpO2 98%   BMI 49.65 kg/m   BP Readings from Last 3  Encounters:  02/06/17 116/82  11/11/16 118/78  08/02/16 122/78    Wt Readings from Last 3 Encounters:  02/06/17 (!) 356 lb (161.5 kg)  11/11/16 (!) 344 lb (156 kg)  08/02/16 (!) 348 lb (157.9 kg)    Physical Exam  Constitutional: He is oriented to person, place, and time. He appears well-developed. No distress.  NAD  HENT:  Mouth/Throat: Oropharynx is clear and moist.  Eyes: Conjunctivae are normal. Pupils are equal, round, and reactive to light.  Neck: Normal range of motion. No JVD present. No thyromegaly present.  Cardiovascular: Normal rate, regular rhythm, normal heart sounds and intact distal pulses. Exam reveals no gallop and no friction rub.  No murmur heard. Pulmonary/Chest: Effort normal and breath sounds normal. No respiratory distress. He has no wheezes. He has no rales. He exhibits no tenderness.  Abdominal: Soft. Bowel sounds are normal. He exhibits no distension and no mass. There is no tenderness. There is no rebound and no guarding.  Musculoskeletal: Normal range of motion. He exhibits no edema or tenderness.  Lymphadenopathy:    He has no cervical adenopathy.  Neurological: He is alert and oriented to person, place, and time. He has normal reflexes. No cranial nerve deficit. He exhibits normal muscle tone. He displays a negative Romberg sign. Coordination and gait normal.  Skin: Skin is warm and dry. No rash noted.  Psychiatric: He has a normal mood and affect. His behavior is normal. Judgment and thought content normal.  obese  Lab  Results  Component Value Date   WBC 5.0 08/02/2016   HGB 14.1 08/02/2016   HCT 42.6 08/02/2016   PLT 294.0 08/02/2016   GLUCOSE 94 08/02/2016   CHOL 154 08/02/2016   TRIG 116.0 08/02/2016   HDL 50.00 08/02/2016   LDLCALC 81 08/02/2016   ALT 31 08/02/2016   AST 21 08/02/2016   NA 138 08/02/2016   K 4.3 08/02/2016   CL 105 08/02/2016   CREATININE 0.90 08/02/2016   BUN 18 08/02/2016   CO2 28 08/02/2016   TSH 1.75 08/02/2016     No results found.  Assessment & Plan:   There are no diagnoses linked to this encounter. I have discontinued Tawni PummelWilliam R. Frees's meloxicam and cyclobenzaprine. I am also having him maintain his cholecalciferol, b complex vitamins, and PARoxetine.  No orders of the defined types were placed in this encounter.    Follow-up: No Follow-up on file.  Sonda PrimesAlex Judd Mccubbin, MD

## 2017-02-14 ENCOUNTER — Telehealth: Payer: Self-pay | Admitting: *Deleted

## 2017-02-14 MED ORDER — MELOXICAM 15 MG PO TABS: 15.0000 mg | ORAL_TABLET | Freq: Every day | ORAL | 0 refills | Status: DC

## 2017-02-14 NOTE — Telephone Encounter (Signed)
Rec'd call pharmacist Spero Geralds(Teran) from [facility]. stating they received script for Meloxicam 15 mg on 02/06/17 for 30 day supply. Will need to change to 90 day to be able to process. Gave verbal to change to 90 day updated med list.../lmb

## 2017-04-05 ENCOUNTER — Other Ambulatory Visit: Payer: Self-pay | Admitting: Internal Medicine

## 2017-08-08 ENCOUNTER — Encounter: Payer: Self-pay | Admitting: Internal Medicine

## 2017-08-21 ENCOUNTER — Encounter: Payer: Self-pay | Admitting: Internal Medicine

## 2017-08-30 ENCOUNTER — Encounter: Payer: Self-pay | Admitting: Internal Medicine

## 2017-09-22 ENCOUNTER — Encounter: Payer: Self-pay | Admitting: Internal Medicine

## 2017-10-20 ENCOUNTER — Encounter: Payer: 59 | Admitting: Internal Medicine

## 2017-11-15 ENCOUNTER — Encounter: Payer: 59 | Admitting: Internal Medicine

## 2018-01-11 ENCOUNTER — Encounter: Payer: 59 | Admitting: Internal Medicine

## 2018-02-13 ENCOUNTER — Other Ambulatory Visit: Payer: Self-pay | Admitting: Internal Medicine

## 2018-02-13 DIAGNOSIS — F418 Other specified anxiety disorders: Secondary | ICD-10-CM

## 2018-02-22 ENCOUNTER — Encounter: Payer: 59 | Admitting: Internal Medicine

## 2018-03-01 ENCOUNTER — Ambulatory Visit (INDEPENDENT_AMBULATORY_CARE_PROVIDER_SITE_OTHER): Payer: 59 | Admitting: Internal Medicine

## 2018-03-01 ENCOUNTER — Encounter: Payer: Self-pay | Admitting: Internal Medicine

## 2018-03-01 ENCOUNTER — Other Ambulatory Visit (INDEPENDENT_AMBULATORY_CARE_PROVIDER_SITE_OTHER): Payer: 59

## 2018-03-01 VITALS — BP 120/84 | HR 82 | Temp 99.3°F | Ht 71.0 in | Wt 358.0 lb

## 2018-03-01 DIAGNOSIS — F418 Other specified anxiety disorders: Secondary | ICD-10-CM

## 2018-03-01 DIAGNOSIS — E559 Vitamin D deficiency, unspecified: Secondary | ICD-10-CM

## 2018-03-01 DIAGNOSIS — Z0000 Encounter for general adult medical examination without abnormal findings: Secondary | ICD-10-CM

## 2018-03-01 DIAGNOSIS — Z6841 Body Mass Index (BMI) 40.0 and over, adult: Secondary | ICD-10-CM

## 2018-03-01 LAB — CBC WITH DIFFERENTIAL/PLATELET
Basophils Absolute: 0 10*3/uL (ref 0.0–0.1)
Basophils Relative: 0.6 % (ref 0.0–3.0)
Eosinophils Absolute: 0.1 10*3/uL (ref 0.0–0.7)
Eosinophils Relative: 1.4 % (ref 0.0–5.0)
HCT: 42.8 % (ref 39.0–52.0)
HEMOGLOBIN: 14.2 g/dL (ref 13.0–17.0)
Lymphocytes Relative: 32.5 % (ref 12.0–46.0)
Lymphs Abs: 2.5 10*3/uL (ref 0.7–4.0)
MCHC: 33.1 g/dL (ref 30.0–36.0)
MCV: 88.9 fl (ref 78.0–100.0)
Monocytes Absolute: 0.6 10*3/uL (ref 0.1–1.0)
Monocytes Relative: 7.3 % (ref 3.0–12.0)
Neutro Abs: 4.4 10*3/uL (ref 1.4–7.7)
Neutrophils Relative %: 58.2 % (ref 43.0–77.0)
Platelets: 330 10*3/uL (ref 150.0–400.0)
RBC: 4.82 Mil/uL (ref 4.22–5.81)
RDW: 14.1 % (ref 11.5–15.5)
WBC: 7.6 10*3/uL (ref 4.0–10.5)

## 2018-03-01 LAB — BASIC METABOLIC PANEL
BUN: 12 mg/dL (ref 6–23)
CO2: 28 mEq/L (ref 19–32)
CREATININE: 0.94 mg/dL (ref 0.40–1.50)
Calcium: 10 mg/dL (ref 8.4–10.5)
Chloride: 104 mEq/L (ref 96–112)
GFR: 118.16 mL/min (ref >60.00)
Glucose, Bld: 77 mg/dL (ref 70–99)
Potassium: 3.9 mEq/L (ref 3.5–5.1)
Sodium: 142 mEq/L (ref 135–145)

## 2018-03-01 LAB — URINALYSIS, ROUTINE W REFLEX MICROSCOPIC
Bilirubin Urine: NEGATIVE
Ketones, ur: NEGATIVE
Leukocytes, UA: NEGATIVE
NITRITE: NEGATIVE
Specific Gravity, Urine: >=1.03 — AB (ref 1.000–1.030)
Total Protein, Urine: 30 — AB
URINE GLUCOSE: NEGATIVE
Urobilinogen, UA: 0.2 (ref 0.0–1.0)
pH: 6 (ref 5.0–8.0)

## 2018-03-01 LAB — HEPATIC FUNCTION PANEL
ALT: 20 U/L (ref 0–53)
AST: 15 U/L (ref 0–37)
Albumin: 4.5 g/dL (ref 3.5–5.2)
Alkaline Phosphatase: 51 U/L (ref 39–117)
Bilirubin, Direct: 0 mg/dL (ref 0.0–0.3)
Total Bilirubin: 0.3 mg/dL (ref 0.2–1.2)
Total Protein: 7.8 g/dL (ref 6.0–8.3)

## 2018-03-01 LAB — LIPID PANEL
Cholesterol: 181 mg/dL (ref 0–200)
HDL: 70.3 mg/dL (ref >39.00)
LDL Cholesterol: 93 mg/dL (ref 0–99)
NonHDL: 110.78
Total CHOL/HDL Ratio: 3
Triglycerides: 89 mg/dL (ref 0.0–149.0)
VLDL: 17.8 mg/dL (ref 0.0–40.0)

## 2018-03-01 LAB — TSH: TSH: 1.85 u[IU]/mL (ref 0.35–4.50)

## 2018-03-01 NOTE — Assessment & Plan Note (Signed)
On Vit D 

## 2018-03-01 NOTE — Assessment & Plan Note (Signed)
We discussed age appropriate health related issues, including available/recomended screening tests and vaccinations. We discussed a need for adhering to healthy diet and exercise. Labs were ordered to be later reviewed . All questions were answered.   

## 2018-03-01 NOTE — Progress Notes (Signed)
Subjective:  Patient ID: Tommy [name], male    DOB: 1985-08-01  Age: 32 y.o. MRN: [id number]  CC: No chief complaint on file.   HPI Tommy GrainWilliam R Awwad presents for a well exam anxiety  Outpatient Medications Prior to Visit  Medication Sig Dispense Refill  . b complex vitamins tablet Take 1 tablet by mouth daily. 100 tablet 3  . cholecalciferol (VITAMIN D) 1000 units tablet Take 2,000 Units by mouth daily.    . meloxicam (MOBIC) 15 MG tablet TAKE 1 TABLET BY MOUTH  DAILY 90 tablet 0  . PARoxetine (PAXIL) 40 MG tablet Take 1 tablet (40 mg total) by mouth daily. 90 tablet 3   No facility-administered medications prior to visit.     ROS: Review of Systems  Constitutional: Negative for appetite change, fatigue and unexpected weight change.  HENT: Negative for congestion, nosebleeds, sneezing, sore throat and trouble swallowing.   Eyes: Negative for itching and visual disturbance.  Respiratory: Negative for cough.   Cardiovascular: Negative for chest pain, palpitations and leg swelling.  Gastrointestinal: Negative for abdominal distention, blood in stool, diarrhea and nausea.  Genitourinary: Negative for frequency and hematuria.  Musculoskeletal: Negative for back pain, gait problem, joint swelling and neck pain.  Skin: Negative for rash.  Neurological: Negative for dizziness, tremors, speech difficulty and weakness.  Psychiatric/Behavioral: Negative for agitation, dysphoric mood, sleep disturbance and suicidal ideas. The patient is nervous/anxious.     Objective:  BP 120/84 (BP Location: Left Arm, Patient Position: Sitting, Cuff Size: Large)   Pulse 82   Temp 99.3 F (37.4 C) (Oral)   Ht 5\' 11"  (1.803 m)   Wt (!) 358 lb (162.4 kg)   SpO2 99%   BMI 49.93 kg/m   BP Readings from Last 3 Encounters:  03/01/18 120/84  02/06/17 116/82  11/11/16 118/78    Wt Readings from Last 3 Encounters:  03/01/18 (!) 358 lb (162.4 kg)  02/06/17 (!) 356 lb (161.5 kg)  11/11/16 (!)  344 lb (156 kg)    Physical Exam Constitutional:      General: He is not in acute distress.    Appearance: He is well-developed.     Comments: NAD  Eyes:     Conjunctiva/sclera: Conjunctivae normal.     Pupils: Pupils are equal, round, and reactive to light.  Neck:     Musculoskeletal: Normal range of motion.     Thyroid: No thyromegaly.     Vascular: No JVD.  Cardiovascular:     Rate and Rhythm: Normal rate and regular rhythm.     Heart sounds: Normal heart sounds. No murmur. No friction rub. No gallop.   Pulmonary:     Effort: Pulmonary effort is normal. No respiratory distress.     Breath sounds: Normal breath sounds. No wheezing or rales.  Chest:     Chest wall: No tenderness.  Abdominal:     General: Bowel sounds are normal. There is no distension.     Palpations: Abdomen is soft. There is no mass.     Tenderness: There is no abdominal tenderness. There is no guarding or rebound.  Musculoskeletal: Normal range of motion.        General: No tenderness.  Lymphadenopathy:     Cervical: No cervical adenopathy.  Skin:    General: Skin is warm and dry.     Findings: No rash.  Neurological:     Mental Status: He is alert and oriented to person, place, and time.  Cranial Nerves: No cranial nerve deficit.     Motor: No abnormal muscle tone.     Coordination: Coordination normal.     Gait: Gait normal.     Deep Tendon Reflexes: Reflexes are normal and symmetric.  Psychiatric:        Behavior: Behavior normal.        Thought Content: Thought content normal.        Judgment: Judgment normal.   obese  Lab Results  Component Value Date   WBC 5.0 08/02/2016   HGB 14.1 08/02/2016   HCT 42.6 08/02/2016   PLT 294.0 08/02/2016   GLUCOSE 101 (H) 02/06/2017   CHOL 154 08/02/2016   TRIG 116.0 08/02/2016   HDL 50.00 08/02/2016   LDLCALC 81 08/02/2016   ALT 31 08/02/2016   AST 21 08/02/2016   NA 139 02/06/2017   K 4.4 02/06/2017   CL 104 02/06/2017   CREATININE 0.94  02/06/2017   BUN 17 02/06/2017   CO2 26 02/06/2017   TSH 2.87 02/06/2017    No results found.  Assessment & Plan:   There are no diagnoses linked to this encounter.   No orders of the defined types were placed in this encounter.    Follow-up: No follow-ups on file.  Sonda PrimesAlex Plotnikov, MD

## 2018-03-01 NOTE — Assessment & Plan Note (Signed)
Paxil 

## 2018-03-01 NOTE — Assessment & Plan Note (Signed)
Wt Readings from Last 3 Encounters:  03/01/18 (!) 358 lb (162.4 kg)  02/06/17 (!) 356 lb (161.5 kg)  11/11/16 (!) 344 lb (156 kg)

## 2018-03-02 ENCOUNTER — Other Ambulatory Visit: Payer: Self-pay | Admitting: Internal Medicine

## 2018-03-02 DIAGNOSIS — R3129 Other microscopic hematuria: Secondary | ICD-10-CM

## 2018-03-02 LAB — HIV ANTIBODY (ROUTINE TESTING W REFLEX): HIV: NONREACTIVE

## 2018-03-22 ENCOUNTER — Encounter: Payer: Self-pay | Admitting: Internal Medicine

## 2018-04-13 ENCOUNTER — Other Ambulatory Visit: Payer: Self-pay | Admitting: Internal Medicine

## 2018-04-13 DIAGNOSIS — F418 Other specified anxiety disorders: Secondary | ICD-10-CM

## 2018-04-13 MED ORDER — PAROXETINE HCL 40 MG PO TABS: 40.0000 mg | ORAL_TABLET | Freq: Every day | ORAL | 1 refills | Status: DC

## 2018-04-13 NOTE — Telephone Encounter (Signed)
Copied from CRM [id number]. Topic: Quick Communication - Rx Refill/Question >> Apr 13, 2018  2:21 PM Angela Nevin wrote: Medication:PARoxetine (PAXIL) 40 MG tablet   Patient is requesting a refill of this medication   Preferred Pharmacy (with phone number or street name):[facility] - [place], [place] - [address] [phone] (Phone) [phone] (Fax)

## 2018-08-30 ENCOUNTER — Ambulatory Visit: Payer: Self-pay | Admitting: Internal Medicine

## 2018-09-05 ENCOUNTER — Ambulatory Visit: Payer: 59 | Admitting: Internal Medicine

## 2018-10-05 ENCOUNTER — Other Ambulatory Visit: Payer: Self-pay | Admitting: Internal Medicine

## 2018-10-05 DIAGNOSIS — F418 Other specified anxiety disorders: Secondary | ICD-10-CM

## 2019-01-21 ENCOUNTER — Other Ambulatory Visit: Payer: Self-pay | Admitting: Internal Medicine

## 2019-01-21 DIAGNOSIS — F418 Other specified anxiety disorders: Secondary | ICD-10-CM

## 2019-01-21 NOTE — Telephone Encounter (Signed)
Requested medication (s) are due for refill today: yes  Requested medication (s) are on the active medication list: yes  Last refill: 10/05/2018  Future visit scheduled: no  Notes to clinic:  Overdue for office visit    Requested Prescriptions  Pending Prescriptions Disp Refills   PARoxetine (PAXIL) 40 MG tablet 90 tablet 0    Sig: Take 1 tablet (40 mg total) by mouth daily. Needs office visit. Was due back in 08/2018.     Psychiatry:  Antidepressants - SSRI Failed - 01/21/2019 10:28 AM      Failed - Valid encounter within last 6 months    Recent Outpatient Visits          10 months ago Well adult exam   [facility] Plot[facility], Evie Lacks, MD   1 year ago Need for influenza vaccination   [facility] Plot[facility], Evie Lacks, MD   2 years ago Low back pain, unspecified back pain laterality, unspecified chronicity, with sciatica presence unspecified   [place], Enid Baas, MD   2 years ago Well adult exam   [place], Evie Lacks, MD   2 years ago Fear of high places   [place], MD             Passed - Completed PHQ-2 or PHQ-9 in the last 360 days.

## 2019-01-21 NOTE — Telephone Encounter (Signed)
Routing to CMA 

## 2019-01-21 NOTE — Telephone Encounter (Signed)
Medication Refill - Medication: PARoxetine (PAXIL) 40 MG tablet    Preferred Pharmacy: [facility]  [address] [place] [address]  Phone: [phone] Fax: [phone]     Pt was advised that RX refills may take up to 3 business days. We ask that you follow-up with your pharmacy.

## 2019-01-22 NOTE — Telephone Encounter (Signed)
LVM to inform patient they need to set up an appointment.

## 2019-02-15 ENCOUNTER — Telehealth: Payer: Self-pay | Admitting: Internal Medicine

## 2019-02-15 ENCOUNTER — Other Ambulatory Visit: Payer: Self-pay | Admitting: Internal Medicine

## 2019-02-15 DIAGNOSIS — F418 Other specified anxiety disorders: Secondary | ICD-10-CM

## 2019-02-15 NOTE — Telephone Encounter (Signed)
Pt needs OV to get refill.

## 2019-02-15 NOTE — Telephone Encounter (Signed)
Appointment scheduled.

## 2019-02-15 NOTE — Telephone Encounter (Signed)
Medication Refill - Medication: PARoxetine (PAXIL) 40 MG tablet [[id number]]    Has the patient contacted their pharmacy? No. (Agent: If no, request that the patient contact the pharmacy for the refill.) (Agent: If yes, when and what did the pharmacy advise?)  Preferred Pharmacy (with phone number or street name): [facility] [place]  Agent: Please be advised that RX refills may take up to 3 business days. We ask that you follow-up with your pharmacy.

## 2019-02-19 ENCOUNTER — Ambulatory Visit: Payer: 59 | Admitting: Internal Medicine

## 2019-03-05 ENCOUNTER — Other Ambulatory Visit: Payer: Self-pay | Admitting: Internal Medicine

## 2019-03-05 DIAGNOSIS — F418 Other specified anxiety disorders: Secondary | ICD-10-CM

## 2019-08-26 ENCOUNTER — Ambulatory Visit: Payer: 59 | Admitting: Internal Medicine

## 2020-04-05 ENCOUNTER — Telehealth: Payer: Self-pay | Admitting: Family

## 2020-04-05 DIAGNOSIS — Z20822 Contact with and (suspected) exposure to covid-19: Secondary | ICD-10-CM

## 2020-04-05 NOTE — Progress Notes (Signed)
E-Visit for Corona Virus Screening  Your current symptoms could be consistent with the coronavirus.  Many health care providers can now test patients at their office but not all are.  [name] has multiple testing sites. For information on our [place] testing locations and hours go to [email or web address]  We are enrolling you in our [place] for [place] . Daily you will receive a questionnaire within the [facility] website. Our COVID 19 response team will be monitoring your responses daily.  Testing Information: The COVID-19 Community Testing sites are testing BY APPOINTMENT ONLY.  You can schedule online at [email or web address]  If you do not have access to a smart phone or computer you may call [phone] for an appointment.   Additional testing sites in the Community:  . For [facility] Testing sites in [facility]  [email or web address]  . For Pop-up testing sites in [place]  [email or web address].co.[email or web address]  . For [facility] [email or web address]  . For [facility] testing in [place] and [employer] [email or web address]  . For [facility] testing in [facility]   [email or web address]  For  more information about community testing call [phone]   Please quarantine yourself while awaiting your test results. Please stay home for a minimum of 10 days from the first day of illness with improving symptoms and you have had 24 hours of no fever (without the use of Tylenol (Acetaminophen) Motrin (Ibuprofen) or any fever reducing medication).  Also - Do not get tested prior to returning to work because once you have had a positive test the test can stay  positive for more than a month in some cases.   You should wear a mask or cloth face covering over your nose and mouth if you must be around other people or animals, including pets (even at home). Try to stay at least 6 feet away from other people. This will protect the people around you.  Please continue good preventive care measures, including:  frequent hand-washing, avoid touching your face, cover coughs/sneezes, stay out of crowds and keep a 6 foot distance from others.  COVID-19 is a respiratory illness with symptoms that are similar to the flu. Symptoms are typically mild to moderate, but there have been cases of severe illness and death due to the virus.   The following symptoms may appear 2-14 days after exposure: . Fever . Cough . Shortness of breath or difficulty breathing . Chills . Repeated shaking with chills . Muscle pain . Headache . Sore throat . New loss of taste or smell . Fatigue . Congestion or runny nose . Nausea or vomiting . Diarrhea  Go to the nearest hospital ED for assessment if fever/cough/breathlessness are severe or illness seems like a threat to life.  It is vitally important that if you feel that you have an infection such as this virus or any other virus that you stay home and away from places where you may spread it to others.  You should avoid contact with people age 31 and older.    You may also take acetaminophen (Tylenol) as needed for fever.  Reduce your risk of any infection by using the same precautions used for avoiding the common cold or flu:  Marland Kitchen Wash your hands often with soap and warm water for at least 20 seconds.  If soap and water are not readily available, use an alcohol-based hand sanitizer with at least 60% alcohol.  . If coughing or sneezing, cover your mouth and nose by coughing or sneezing  into the elbow areas of your shirt or coat, into a tissue or into your sleeve (not your hands). . Avoid shaking hands with others and consider head  nods or verbal greetings only. . Avoid touching your eyes, nose, or mouth with unwashed hands.  . Avoid close contact with people who are sick. . Avoid places or events with large numbers of people in one location, like concerts or sporting events. . Carefully consider travel plans you have or are making. . If you are planning any travel outside or inside the [place], visit the CDC's Travelers' Health webpage for the latest health notices. . If you have some symptoms but not all symptoms, continue to monitor at home and seek medical attention if your symptoms worsen. . If you are having a medical emergency, call 911.  HOME CARE . Only take medications as instructed by your medical team. . Drink plenty of fluids and get plenty of rest. . A steam or ultrasonic humidifier can help if you have congestion.   GET HELP RIGHT AWAY IF YOU HAVE EMERGENCY WARNING SIGNS** FOR COVID-19. If you or someone is showing any of these signs seek emergency medical care immediately. Call 911 or proceed to your closest emergency facility if: . You develop worsening high fever. . Trouble breathing . Bluish lips or face . Persistent pain or pressure in the chest . New confusion . Inability to wake or stay awake . You cough up blood. . Your symptoms become more severe  **This list is not all possible symptoms. Contact your medical provider for any symptoms that are sever or concerning to you.  MAKE SURE YOU   Understand these instructions.  Will watch your condition.  Will get help right away if you are not doing well or get worse.  Your e-visit answers were reviewed by a board certified advanced clinical practitioner to complete your personal care plan.  Depending on the condition, your plan could have included both over the counter or prescription medications.  If there is a problem please reply once you have received a response from your provider.  Your safety is important to [place].  If you have drug allergies  check your prescription carefully.    You can use MyChart to ask questions about today's visit, request a non-urgent call back, or ask for a work or school excuse for 24 hours related to this e-Visit. If it has been greater than 24 hours you will need to follow up with your provider, or enter a new e-Visit to address those concerns. You will get an e-mail in the next two days asking about your experience.  I hope that your e-visit has been valuable and will speed your recovery. Thank you for using e-visits.   Greater than 5 minutes, yet less than 10 minutes of time have been spent researching, coordinating, and implementing care for this patient today.  Thank you for the details you included in the comment boxes. Those details are very helpful in determining the best course of treatment for you and help [place] to provide the best care.

## 2020-06-23 ENCOUNTER — Encounter: Payer: Self-pay | Admitting: Internal Medicine

## 2020-06-23 ENCOUNTER — Ambulatory Visit (INDEPENDENT_AMBULATORY_CARE_PROVIDER_SITE_OTHER): Payer: 59 | Admitting: Internal Medicine

## 2020-06-23 ENCOUNTER — Other Ambulatory Visit: Payer: Self-pay

## 2020-06-23 ENCOUNTER — Other Ambulatory Visit: Payer: Self-pay | Admitting: Internal Medicine

## 2020-06-23 VITALS — BP 120/90 | HR 104 | Temp 98.8°F | Ht 71.0 in | Wt 328.6 lb

## 2020-06-23 DIAGNOSIS — E559 Vitamin D deficiency, unspecified: Secondary | ICD-10-CM

## 2020-06-23 DIAGNOSIS — F418 Other specified anxiety disorders: Secondary | ICD-10-CM

## 2020-06-23 DIAGNOSIS — F339 Major depressive disorder, recurrent, unspecified: Secondary | ICD-10-CM

## 2020-06-23 DIAGNOSIS — Z6841 Body Mass Index (BMI) 40.0 and over, adult: Secondary | ICD-10-CM

## 2020-06-23 DIAGNOSIS — Z0000 Encounter for general adult medical examination without abnormal findings: Secondary | ICD-10-CM

## 2020-06-23 LAB — CBC WITH DIFFERENTIAL/PLATELET
Basophils Absolute: 0.1 10*3/uL (ref 0.0–0.1)
Basophils Relative: 0.7 % (ref 0.0–3.0)
Eosinophils Absolute: 0 10*3/uL (ref 0.0–0.7)
Eosinophils Relative: 0.1 % (ref 0.0–5.0)
HCT: 45.2 % (ref 39.0–52.0)
Hemoglobin: 15.2 g/dL (ref 13.0–17.0)
Lymphocytes Relative: 18.8 % (ref 12.0–46.0)
Lymphs Abs: 1.4 10*3/uL (ref 0.7–4.0)
MCHC: 33.5 g/dL (ref 30.0–36.0)
MCV: 88.9 fl (ref 78.0–100.0)
Monocytes Absolute: 0.6 10*3/uL (ref 0.1–1.0)
Monocytes Relative: 8 % (ref 3.0–12.0)
Neutro Abs: 5.5 10*3/uL (ref 1.4–7.7)
Neutrophils Relative %: 72.4 % (ref 43.0–77.0)
Platelets: 358 10*3/uL (ref 150.0–400.0)
RBC: 5.09 Mil/uL (ref 4.22–5.81)
RDW: 14.3 % (ref 11.5–15.5)
WBC: 7.6 10*3/uL (ref 4.0–10.5)

## 2020-06-23 LAB — URINALYSIS, ROUTINE W REFLEX MICROSCOPIC
Bilirubin Urine: NEGATIVE
Ketones, ur: 40 — AB
Leukocytes,Ua: NEGATIVE
Nitrite: NEGATIVE
Specific Gravity, Urine: >=1.03 — AB (ref 1.000–1.030)
Total Protein, Urine: 100 — AB
Urine Glucose: NEGATIVE
Urobilinogen, UA: 0.2 (ref 0.0–1.0)
pH: 5.5 (ref 5.0–8.0)

## 2020-06-23 LAB — COMPREHENSIVE METABOLIC PANEL
ALT: 11 U/L (ref 0–53)
AST: 13 U/L (ref 0–37)
Albumin: 4.7 g/dL (ref 3.5–5.2)
Alkaline Phosphatase: 53 U/L (ref 39–117)
BUN: 15 mg/dL (ref 6–23)
CO2: 24 mEq/L (ref 19–32)
Calcium: 10.4 mg/dL (ref 8.4–10.5)
Chloride: 101 mEq/L (ref 96–112)
Creatinine, Ser: 1.04 mg/dL (ref 0.40–1.50)
GFR: 92.52 mL/min (ref >60.00)
Glucose, Bld: 88 mg/dL (ref 70–99)
Potassium: 3.8 mEq/L (ref 3.5–5.1)
Sodium: 138 mEq/L (ref 135–145)
Total Bilirubin: 0.6 mg/dL (ref 0.2–1.2)
Total Protein: 8.7 g/dL — ABNORMAL HIGH (ref 6.0–8.3)

## 2020-06-23 LAB — LIPID PANEL
Cholesterol: 161 mg/dL (ref 0–200)
HDL: 50.8 mg/dL (ref >39.00)
LDL Cholesterol: 97 mg/dL (ref 0–99)
NonHDL: 109.92
Total CHOL/HDL Ratio: 3
Triglycerides: 64 mg/dL (ref 0.0–149.0)
VLDL: 12.8 mg/dL (ref 0.0–40.0)

## 2020-06-23 LAB — VITAMIN D 25 HYDROXY (VIT D DEFICIENCY, FRACTURES): VITD: 26.64 ng/mL — ABNORMAL LOW (ref 30.00–100.00)

## 2020-06-23 LAB — TSH: TSH: 1.02 u[IU]/mL (ref 0.35–4.50)

## 2020-06-23 LAB — VITAMIN B12: Vitamin B-12: 144 pg/mL — ABNORMAL LOW (ref 211–911)

## 2020-06-23 MED ORDER — VITAMIN D (ERGOCALCIFEROL) 1.25 MG (50000 UNIT) PO CAPS: 50000.0000 [IU] | ORAL_CAPSULE | ORAL | 0 refills | Status: DC

## 2020-06-23 MED ORDER — VITAMIN D3 50 MCG (2000 UT) PO CAPS: 2000.0000 [IU] | ORAL_CAPSULE | Freq: Every day | ORAL | 3 refills | Status: DC

## 2020-06-23 NOTE — Assessment & Plan Note (Addendum)
It has been worse over past several days.  He had thoughts of leaving this world, but no plans to do it.  He does not have it done. He broke up with his girlfriend over 4 years 1 month ago.  He has 2 jobs one full-time for [employer] and 1 part-time for [employer].  He has been working 60 hours a week. He is here today with his mother. He has been tearful, crying at work. He has a history of inpatient behavioral med treatment in 2015. The patient was referred to counseling. Tommy [name] will stay with his mom and she will watch him.  He has no suicidal plans

## 2020-06-23 NOTE — Progress Notes (Signed)
Subjective:  Patient ID: Tommy [name], male    DOB: January 30, 1986  Age: 35 y.o. MRN: [id number]  CC: Depression and Anxiety   HPI Tommy [name] presents for depression - it has been worse over past several days.  He had thoughts of leaving this world, but no plans to do it.  He does not have it done. She broke up with his girlfriend over 4 years 1 month ago.  He has 2 jobs one full-time for [employer] and 1 part-time for [employer].  He has been working 60 hours a week. He is here today with his mother. He has been tearful, crying at work. He has a history of inpatient behavioral med treatment in 2015.  He had COVID in January that was pretty bad   Outpatient Medications Prior to Visit  Medication Sig Dispense Refill  . PARoxetine (PAXIL) 40 MG tablet TAKE 1 TABLET BY MOUTH  DAILY 90 tablet 1  . b complex vitamins tablet Take 1 tablet by mouth daily. (Patient not taking: Reported on 06/23/2020) 100 tablet 3  . cholecalciferol (VITAMIN D) 1000 units tablet Take 2,000 Units by mouth daily. (Patient not taking: Reported on 06/23/2020)    . meloxicam (MOBIC) 15 MG tablet TAKE 1 TABLET BY MOUTH  DAILY (Patient not taking: Reported on 06/23/2020) 90 tablet 0   No facility-administered medications prior to visit.    ROS: Review of Systems  Constitutional: Negative for appetite change, fatigue and unexpected weight change.  HENT: Negative for congestion, nosebleeds, sneezing, sore throat and trouble swallowing.   Eyes: Negative for itching and visual disturbance.  Respiratory: Negative for cough.   Cardiovascular: Negative for chest pain, palpitations and leg swelling.  Gastrointestinal: Negative for abdominal distention, blood in stool, diarrhea and nausea.  Genitourinary: Negative for frequency and hematuria.  Musculoskeletal: Negative for back pain, gait problem, joint swelling and neck pain.  Skin: Negative for rash.  Neurological: Negative for dizziness, tremors, speech  difficulty and weakness.  Psychiatric/Behavioral: Positive for behavioral problems, decreased concentration, dysphoric mood, self-injury and suicidal ideas. Negative for agitation and sleep disturbance. The patient is nervous/anxious.     Objective:  BP 120/90 (BP Location: Left Arm)   Pulse (!) 104   Temp 98.8 F (37.1 C) (Oral)   Ht 5\' 11"  (1.803 m)   Wt (!) 328 lb 9.6 oz (149.1 kg)   SpO2 97%   BMI 45.83 kg/m   BP Readings from Last 3 Encounters:  06/23/20 120/90  03/01/18 120/84  02/06/17 116/82    Wt Readings from Last 3 Encounters:  06/23/20 (!) 328 lb 9.6 oz (149.1 kg)  03/01/18 (!) 358 lb (162.4 kg)  02/06/17 (!) 356 lb (161.5 kg)    Physical Exam Constitutional:      General: He is not in acute distress.    Appearance: He is well-developed.     Comments: NAD  Eyes:     Conjunctiva/sclera: Conjunctivae normal.     Pupils: Pupils are equal, round, and reactive to light.  Neck:     Thyroid: No thyromegaly.     Vascular: No JVD.  Cardiovascular:     Rate and Rhythm: Normal rate and regular rhythm.     Heart sounds: Normal heart sounds. No murmur heard. No friction rub. No gallop.   Pulmonary:     Effort: Pulmonary effort is normal. No respiratory distress.     Breath sounds: Normal breath sounds. No wheezing or rales.  Chest:     Chest  wall: No tenderness.  Abdominal:     General: Bowel sounds are normal. There is no distension.     Palpations: Abdomen is soft. There is no mass.     Tenderness: There is no abdominal tenderness. There is no guarding or rebound.  Musculoskeletal:        General: No tenderness. Normal range of motion.     Cervical back: Normal range of motion.  Lymphadenopathy:     Cervical: No cervical adenopathy.  Skin:    General: Skin is warm and dry.     Findings: No rash.  Neurological:     Mental Status: He is alert and oriented to person, place, and time.     Cranial Nerves: No cranial nerve deficit.     Motor: No abnormal  muscle tone.     Coordination: Coordination normal.     Gait: Gait normal.     Deep Tendon Reflexes: Reflexes are normal and symmetric.  Psychiatric:        Behavior: Behavior normal.        Thought Content: Thought content normal.        Judgment: Judgment normal.     Lab Results  Component Value Date   WBC 7.6 03/01/2018   HGB 14.2 03/01/2018   HCT 42.8 03/01/2018   PLT 330.0 03/01/2018   GLUCOSE 77 03/01/2018   CHOL 181 03/01/2018   TRIG 89.0 03/01/2018   HDL 70.30 03/01/2018   LDLCALC 93 03/01/2018   ALT 20 03/01/2018   AST 15 03/01/2018   NA 142 03/01/2018   K 3.9 03/01/2018   CL 104 03/01/2018   CREATININE 0.94 03/01/2018   BUN 12 03/01/2018   CO2 28 03/01/2018   TSH 1.85 03/01/2018    No results found.  Assessment & Plan:    Sonda Primes, MD

## 2020-06-23 NOTE — Assessment & Plan Note (Signed)
Check Vit D 

## 2020-06-23 NOTE — Assessment & Plan Note (Signed)
Better. Tommy [name] has lost weight

## 2020-06-23 NOTE — Assessment & Plan Note (Signed)
Paxil 

## 2020-06-24 ENCOUNTER — Other Ambulatory Visit: Payer: Self-pay | Admitting: Internal Medicine

## 2020-06-24 MED ORDER — B-12 5000 MCG SL SUBL: SUBLINGUAL_TABLET | SUBLINGUAL | 1 refills | Status: DC

## 2020-06-24 NOTE — Telephone Encounter (Signed)
Printed forms giving to [place].Marland KitchenRaechel [name]

## 2020-06-24 NOTE — Telephone Encounter (Signed)
Spoke w/the pt concerning his labs, and he states he need to make sure that the dates on the FMLA start w/ 06/25/20. He is planning on returning back on the 18th.Marland KitchenRaechel Chute

## 2020-06-25 NOTE — Telephone Encounter (Signed)
Forms have been completed and placed in providers box to review and sign.  °

## 2020-06-29 NOTE — Telephone Encounter (Signed)
Dr.Plotnikov are you ok with extending to 07/06/20?

## 2020-06-29 NOTE — Telephone Encounter (Signed)
Patient called and was wondering if the his return date could be extended to Monday 07-06-20. He can be reached at [phone]. Please advise

## 2020-06-29 NOTE — Telephone Encounter (Signed)
See other office note for documenting. I have took care of this and informed patient. Thank you.

## 2020-06-29 NOTE — Telephone Encounter (Signed)
Update has been made to forms. Forms have been re faxed. Patient informed.

## 2020-07-01 NOTE — Telephone Encounter (Signed)
Patient calling back, states employer does not have forms as of today  Requesting they be emailed to [email or web address]  Phone Gino at [phone]

## 2020-07-02 NOTE — Telephone Encounter (Signed)
Form has been emailed to [email or web address] & [email or web address] as requested.

## 2020-07-06 ENCOUNTER — Telehealth: Payer: Self-pay | Admitting: *Deleted

## 2020-07-06 NOTE — Telephone Encounter (Signed)
Valentina Gu,  I agree with your advice to take Tommy [name] to ER.  We need to start him on B12 shots  Thanks,  AP   Message text    Message was in pt mother chart Cherly Hensen Tuitt). Will call pt to f/u in B12 injections.Marland KitchenRaechel Chute

## 2020-07-07 ENCOUNTER — Other Ambulatory Visit: Payer: Self-pay

## 2020-07-07 ENCOUNTER — Ambulatory Visit: Payer: Self-pay | Admitting: Internal Medicine

## 2020-07-08 ENCOUNTER — Encounter: Payer: Self-pay | Admitting: Internal Medicine

## 2020-07-08 ENCOUNTER — Ambulatory Visit: Payer: 59 | Admitting: Internal Medicine

## 2020-07-08 VITALS — BP 120/86 | HR 88 | Temp 98.4°F | Ht 71.0 in | Wt 313.6 lb

## 2020-07-08 DIAGNOSIS — E538 Deficiency of other specified B group vitamins: Secondary | ICD-10-CM | POA: Diagnosis not present

## 2020-07-08 DIAGNOSIS — F418 Other specified anxiety disorders: Secondary | ICD-10-CM

## 2020-07-08 DIAGNOSIS — F339 Major depressive disorder, recurrent, unspecified: Secondary | ICD-10-CM | POA: Diagnosis not present

## 2020-07-08 DIAGNOSIS — R319 Hematuria, unspecified: Secondary | ICD-10-CM

## 2020-07-08 DIAGNOSIS — Z6841 Body Mass Index (BMI) 40.0 and over, adult: Secondary | ICD-10-CM

## 2020-07-08 MED ORDER — B-12 5000 MCG SL SUBL: SUBLINGUAL_TABLET | SUBLINGUAL | 1 refills | Status: DC

## 2020-07-08 MED ORDER — CYANOCOBALAMIN 1000 MCG/ML IJ SOLN
1000.0000 ug | Freq: Once | INTRAMUSCULAR | Status: AC
  Administered 2020-07-08: 1000 ug via INTRAMUSCULAR

## 2020-07-08 NOTE — Progress Notes (Signed)
Subjective:  Patient ID: Tommy [name], male    DOB: 09-03-1985  Age: 35 y.o. MRN: [id number]  CC: Follow-up (2 week f/u)   HPI Tommy [name] presents for depression, B12 deficiency, anxiety, suicidal ideation Pt has not started B12 yet  Regis went to ER on 4/22:  Per hx: "Clinical Summary: Tommy [name] is a Single 35 y.o. male with a history of adjustment d/o after break ups who presented to the Emergency Department on Voluntary status on 07/03/2020 secondary to concerns of reported suicidal ideation without a strong plan or intent.  Clinical Impression: Patient is a 35 y/o single, African American, male who was brought to the ED via POV on a voluntary basis with vague SI. Pt denies HI/AH/VH. Pt reports he reached out to a friend today after feeling "overwhelmed" for three weeks. The relationship with his girlfriend ended and he has felt anxious and has not been eating or sleeping well. Pt reports that his doctor started him on Paxil to help with feeling anxious and has initiated a referral for mental health counseling. Pt also has fleeting thoughts of suicide with little actual desire to act on these thoughts. Pt states he had a similar, overwhelmed feeling after a different relationship ended 8 yrs ago. He said the feelings eventually went away.  Diagnosis: Adjustment Disorder with Anxiety and Depression Hospital Problem List: * No active hospital problems. *   Although patient presented to the emergency room secondary to SI, risk factors are mitigated by current protective factors:no active psychosis, religious/spiritual belief system, supportive family/friends, motivated for treatment, future planning, patient able to vocalize needs and want for help, sense of responsibility to family and able to state reasons for living and involved in extracurricular activities. This patient does NOT meet [facility] involuntary commitment criteria. Pt requested outpt resources  for online and providers in [place], [place] which were placed in the depart instructions in the chart .   Plan: Although future behaviors cannot be predicted, patient at this time does not present with imminent risk to self and/or others and is recommended for outpatient services.  --Disposition: D/C to outpt in his area.     Counseling/Coordination of Care: Greater than 50% of this 120 min minute visit was spent counseling the patient Patient/Face to Face, Patient/Tele-Psych, patient's family and/or caregivers about, or coordinating care in regards to above plan. Case was discussed including but not limited to risk factors, clinical impression and recommended disposition with EDPA Lodema Hong who is in agreement with above plan.  HPI: From provider note- Tommy [name] is a 35 y.o. male who presents to ED today for evaluation of suicidal ideations. Patient states approximately 3 weeks ago he and his girlfriend broke up, they have had numerous verbal fights since that time. He also found out that she had a miscarriage with his baby and is now seeing another man. He endorses history of numerous difficult relationships in the past that caused him a significant amount of anxiety and depression. He states most of his friends are married and have happy relationships and he desires this. He also reports that he quit his job this week. This morning he called his longtime best friend as he felt like he was panicking and he [place] to [place] to spend time with him. His friend then prompted him to call the suicide helpline which they did but he states this was not helpful so they brought him to the ED for evaluation. In  regards to his suicidal ideations, he states he sleeps with a machete by his bedside and will run the blade over his abdomen while he lays in bed seeing if it would cut him or if it would be painful. He has thought about killing himself numerous times via this mechanism but has never actually  gone through with it but denies any plans to attempt at this currently. Protective factors include strong support system from friends and family and his faith. Denies auditory or visual hallucinations, denies homicidal ideations. Denies drug use.  Collateral: pt declined   Social History   Social History Narrative  Social Documentation   Social History (Last Updated/Reviewed by Suzette Battiest on 07/03/20)  Living Arrangement: alone  Guardian/Payee: no,  Marital Status: single  Children: no,  Education: high school diploma/GED  Current School: n/a  Income/Employment/Disability: currently employed  Social Support: Adequate  Religious Affiliation: yes,  Financial planner: no  Access to Firearm: no   History of Trauma: none; Informant: the patient  Exposure/Witness to Violence: no  Protective Services Involvement: no   Legal History (including incarceration history): no, n/a  Current/Pending Printmaker and Court Dates: n/a  Location manager Name and Phone Number: n/a   Behavioral Health / Substance Use Treatment History (Last Updated/Reviewed by Suzette Battiest on 07/03/20)  Previous Diagnoses: Anxiety Disorder NOS  Current Outpatient Providers: PCP prescribes Paxil  Previous Behavioral Health Treatment: n/a  Previous Substance Use Outpatient Attempts: n/a  Past Detox or Rehab Admissions (list dates): n/a  History of Inpatient Psychiatric Hospitalizations: no  Past Medication Trials: no  Medication Compliance Issues: no  History of Suicide Attempts: no  History of Non-Suicidal Self-Harm: no  History of Physical Aggression/Violence/HI: no  History of Traumatic Brain Injury: no  History of Intellectual/Developmental Disability? no  If IDD present:  IQ deficits are: None  Adaptive deficits are None  "    Outpatient Medications Prior to Visit  Medication Sig Dispense Refill  . Cholecalciferol (VITAMIN D3) 50 MCG (2000 UT) capsule Take 1 capsule  (2,000 Units total) by mouth daily. 100 capsule 3  . PARoxetine (PAXIL) 40 MG tablet TAKE 1 TABLET BY MOUTH  DAILY 90 tablet 1  . Vitamin D, Ergocalciferol, (DRISDOL) 1.25 MG (50000 UNIT) CAPS capsule Take 1 capsule (50,000 Units total) by mouth every 7 (seven) days. 6 capsule 0  . Cyanocobalamin (B-12) 5000 MCG SUBL Take 1 tablet sublingually daily (Patient not taking: Reported on 07/08/2020) 30 tablet 1   No facility-administered medications prior to visit.    ROS: Review of Systems  Constitutional: Positive for unexpected weight change. Negative for appetite change and fatigue.  HENT: Negative for congestion, nosebleeds, sneezing, sore throat and trouble swallowing.   Eyes: Negative for itching and visual disturbance.  Respiratory: Negative for cough.   Cardiovascular: Negative for chest pain, palpitations and leg swelling.  Gastrointestinal: Negative for abdominal distention, blood in stool, diarrhea and nausea.  Genitourinary: Negative for frequency and hematuria.  Musculoskeletal: Negative for back pain, gait problem, joint swelling and neck pain.  Skin: Negative for rash.  Neurological: Negative for dizziness, tremors, speech difficulty and weakness.  Psychiatric/Behavioral: Positive for decreased concentration and dysphoric mood. Negative for agitation, self-injury, sleep disturbance and suicidal ideas. The patient is nervous/anxious.     Objective:  BP 120/86 (BP Location: Left Arm)   Pulse 88   Temp 98.4 F (36.9 C) (Oral)   Ht 5\' 11"  (1.803 m)   Wt (!) 313 lb 9.6 oz (142.2  kg)   SpO2 98%   BMI 43.74 kg/m   BP Readings from Last 3 Encounters:  07/08/20 120/86  06/23/20 120/90  03/01/18 120/84    Wt Readings from Last 3 Encounters:  07/08/20 (!) 313 lb 9.6 oz (142.2 kg)  06/23/20 (!) 328 lb 9.6 oz (149.1 kg)  03/01/18 (!) 358 lb (162.4 kg)    Physical Exam Constitutional:      General: He is not in acute distress.    Appearance: He is well-developed.      Comments: NAD  Eyes:     Conjunctiva/sclera: Conjunctivae normal.     Pupils: Pupils are equal, round, and reactive to light.  Neck:     Thyroid: No thyromegaly.     Vascular: No JVD.  Cardiovascular:     Rate and Rhythm: Normal rate and regular rhythm.     Heart sounds: Normal heart sounds. No murmur heard. No friction rub. No gallop.   Pulmonary:     Effort: Pulmonary effort is normal. No respiratory distress.     Breath sounds: Normal breath sounds. No wheezing or rales.  Chest:     Chest wall: No tenderness.  Abdominal:     General: Bowel sounds are normal. There is no distension.     Palpations: Abdomen is soft. There is no mass.     Tenderness: There is no abdominal tenderness. There is no guarding or rebound.  Musculoskeletal:        General: No tenderness. Normal range of motion.     Cervical back: Normal range of motion.  Lymphadenopathy:     Cervical: No cervical adenopathy.  Skin:    General: Skin is warm and dry.     Findings: No rash.  Neurological:     Mental Status: He is alert and oriented to person, place, and time.     Cranial Nerves: No cranial nerve deficit.     Motor: No abnormal muscle tone.     Coordination: Coordination abnormal.     Gait: Gait normal.     Deep Tendon Reflexes: Reflexes are normal and symmetric.  Psychiatric:        Behavior: Behavior normal.        Thought Content: Thought content normal.        Judgment: Judgment normal.     Lab Results  Component Value Date   WBC 7.6 06/23/2020   HGB 15.2 06/23/2020   HCT 45.2 06/23/2020   PLT 358.0 06/23/2020   GLUCOSE 88 06/23/2020   CHOL 161 06/23/2020   TRIG 64.0 06/23/2020   HDL 50.80 06/23/2020   LDLCALC 97 06/23/2020   ALT 11 06/23/2020   AST 13 06/23/2020   NA 138 06/23/2020   K 3.8 06/23/2020   CL 101 06/23/2020   CREATININE 1.04 06/23/2020   BUN 15 06/23/2020   CO2 24 06/23/2020   TSH 1.02 06/23/2020    No results found.  Assessment & Plan:    Sonda Primes,  MD

## 2020-07-08 NOTE — Addendum Note (Signed)
Addended by: Deatra James on: 07/08/2020 10:02 AM   Modules accepted: Orders

## 2020-07-08 NOTE — Telephone Encounter (Signed)
Pt saw Md today closing encounter.Marland KitchenRaechel Chute

## 2020-07-08 NOTE — Assessment & Plan Note (Signed)
Cont w/Paxil Correct B12 def problem Start seeing a counselor

## 2020-07-08 NOTE — Assessment & Plan Note (Addendum)
Discussed Cont w/Paxil

## 2020-07-08 NOTE — Assessment & Plan Note (Signed)
Wt Readings from Last 3 Encounters:  07/08/20 (!) 313 lb 9.6 oz (142.2 kg)  06/23/20 (!) 328 lb 9.6 oz (149.1 kg)  03/01/18 (!) 358 lb (162.4 kg)  Not eating

## 2020-07-09 NOTE — Addendum Note (Signed)
Addended by: Deatra James on: 07/09/2020 12:03 PM   Modules accepted: Orders

## 2020-07-13 ENCOUNTER — Encounter (HOSPITAL_COMMUNITY): Payer: Self-pay | Admitting: Emergency Medicine

## 2020-07-13 ENCOUNTER — Other Ambulatory Visit: Payer: Self-pay

## 2020-07-13 ENCOUNTER — Emergency Department (HOSPITAL_COMMUNITY)
Admission: EM | Admit: 2020-07-13 | Discharge: 2020-07-14 | Disposition: A | Discharge status: Home / Self Care | Payer: 59 | Source: Home / Self Care | Attending: Emergency Medicine | Admitting: Emergency Medicine

## 2020-07-13 DIAGNOSIS — Z87891 Personal history of nicotine dependence: Secondary | ICD-10-CM | POA: Insufficient documentation

## 2020-07-13 DIAGNOSIS — Z20822 Contact with and (suspected) exposure to covid-19: Secondary | ICD-10-CM | POA: Insufficient documentation

## 2020-07-13 DIAGNOSIS — Y909 Presence of alcohol in blood, level not specified: Secondary | ICD-10-CM | POA: Insufficient documentation

## 2020-07-13 DIAGNOSIS — R000 Tachycardia, unspecified: Secondary | ICD-10-CM | POA: Insufficient documentation

## 2020-07-13 DIAGNOSIS — R45851 Suicidal ideations: Secondary | ICD-10-CM

## 2020-07-13 DIAGNOSIS — F329 Major depressive disorder, single episode, unspecified: Secondary | ICD-10-CM | POA: Insufficient documentation

## 2020-07-13 LAB — COMPREHENSIVE METABOLIC PANEL
ALT: 14 U/L (ref 0–44)
AST: 14 U/L — ABNORMAL LOW (ref 15–41)
Albumin: 4.9 g/dL (ref 3.5–5.0)
Alkaline Phosphatase: 43 U/L (ref 38–126)
Anion gap: 13 (ref 5–15)
BUN: 17 mg/dL (ref 6–20)
CO2: 21 mmol/L — ABNORMAL LOW (ref 22–32)
Calcium: 10.1 mg/dL (ref 8.9–10.3)
Chloride: 102 mmol/L (ref 98–111)
Creatinine, Ser: 1.26 mg/dL — ABNORMAL HIGH (ref 0.61–1.24)
GFR, Estimated: >60 mL/min (ref >60)
Glucose, Bld: 105 mg/dL — ABNORMAL HIGH (ref 70–99)
Potassium: 3.8 mmol/L (ref 3.5–5.1)
Sodium: 136 mmol/L (ref 135–145)
Total Bilirubin: 0.8 mg/dL (ref 0.3–1.2)
Total Protein: 8.4 g/dL — ABNORMAL HIGH (ref 6.5–8.1)

## 2020-07-13 LAB — RESP PANEL BY RT-PCR (FLU A&B, COVID) ARPGX2
Influenza A by PCR: NEGATIVE
Influenza B by PCR: NEGATIVE
SARS Coronavirus 2 by RT PCR: NEGATIVE

## 2020-07-13 LAB — CBC
HCT: 48.8 % (ref 39.0–52.0)
Hemoglobin: 16.1 g/dL (ref 13.0–17.0)
MCH: 29.6 pg (ref 26.0–34.0)
MCHC: 33 g/dL (ref 30.0–36.0)
MCV: 89.7 fL (ref 80.0–100.0)
Platelets: 364 10*3/uL (ref 150–400)
RBC: 5.44 MIL/uL (ref 4.22–5.81)
RDW: 13.1 % (ref 11.5–15.5)
WBC: 6.7 10*3/uL (ref 4.0–10.5)
nRBC: 0 % (ref 0.0–0.2)

## 2020-07-13 LAB — RAPID URINE DRUG SCREEN, HOSP PERFORMED
Amphetamines: NOT DETECTED
Barbiturates: NOT DETECTED
Benzodiazepines: NOT DETECTED
Cocaine: NOT DETECTED
Opiates: NOT DETECTED
Tetrahydrocannabinol: NOT DETECTED

## 2020-07-13 LAB — ETHANOL: Alcohol, Ethyl (B): <10 mg/dL (ref <10)

## 2020-07-13 LAB — SALICYLATE LEVEL: Salicylate Lvl: <7 mg/dL — ABNORMAL LOW (ref 7.0–30.0)

## 2020-07-13 LAB — ACETAMINOPHEN LEVEL: Acetaminophen (Tylenol), Serum: <10 ug/mL — ABNORMAL LOW (ref 10–30)

## 2020-07-13 MED ORDER — PAROXETINE HCL 20 MG PO TABS
40.0000 mg | ORAL_TABLET | Freq: Every day | ORAL | Status: DC
  Administered 2020-07-13 – 2020-07-14 (×2): 40 mg via ORAL
  Filled 2020-07-13 (×2): qty 2

## 2020-07-13 MED ORDER — ONDANSETRON HCL 4 MG PO TABS: 4.0000 mg | ORAL_TABLET | Freq: Three times a day (TID) | ORAL | Status: DC | PRN

## 2020-07-13 MED ORDER — SODIUM CHLORIDE 0.9 % IV BOLUS
1000.0000 mL | Freq: Once | INTRAVENOUS | Status: AC
  Administered 2020-07-13: 1000 mL via INTRAVENOUS

## 2020-07-13 MED ORDER — ZOLPIDEM TARTRATE 5 MG PO TABS: 5.0000 mg | ORAL_TABLET | Freq: Every evening | ORAL | Status: DC | PRN

## 2020-07-13 MED ORDER — ACETAMINOPHEN 325 MG PO TABS: 650.0000 mg | ORAL_TABLET | ORAL | Status: DC | PRN

## 2020-07-13 MED ORDER — ALUM & MAG HYDROXIDE-SIMETH 200-200-20 MG/5ML PO SUSP: 30.0000 mL | Freq: Four times a day (QID) | ORAL | Status: DC | PRN

## 2020-07-13 NOTE — BH Assessment (Signed)
Comprehensive Clinical Assessment (CCA) Note  07/13/2020 Tommy [name] [id number]   Disposition Melbourne Abts, PA, patient meets inpatient criteria. Skip Mayer, [facility], patient currently in review for placement at [facility] Dr. Rubin Payor, Laveda Norman, PA-C and [place], RN, informed via secure chat of patients disposition.  The patient demonstrates the following risk factors for suicide: Chronic risk factors for suicide include: previous suicide attempts 2 days ago. Acute risk factors for suicide include: relationship discord/breakup. Protective factors for this patient include: positive social support and responsibility to others (children, family). Considering these factors, the overall suicide risk at this point appears to be high. Patient is not appropriate for outpatient follow up.  Flowsheet Row ED from 07/13/2020 in [facility]ENCY DEPT  C-SSRS RISK CATEGORY High Risk     1:1 risk  Tommy [name] is a 35 year old male presenting voluntary to [facility], with significant history of depression, anxiety, obesity, presenting today due to SI. Patient is currently suicidal. Patient reported SI with attempt on cutting himself in the abdomen with machete and knife 2 days ago. Patient reported stressor/trigger was his recent break up with girlfriend of 6 years about a month ago. Patient reported worsening depressive symptoms. Patient reported quitting 2 jobs, as he is unable to function. Patient reported poor appetite and increased sleep. Patient was inpatient for psych 01/2014 due to SI. Patient reported being seen in the ED for SI and depression in the past 2 weeks. Patient currently resides alone. Patient denied HI, psychosis and alcohol/drug usage.   Patient is currently not receiving any outpatient mental health services. Patient denied being prescribed and taking any psych medications.   Collateral contact, Angus Palms, mother, accompanied son to ED and present during assessment. Consent  received from patient. Mother reported she is not able to keep patient safe in her home. Mother reported patient needs inpatient treatment as he has not followed up with outpatient therapy services. Mother reported going to patients home and talking with him and he stared I don't want to be here anymore and that he had machete and knives to harm himself. Mother then called 911 for escort to ED. Patient denied access to guns. Patient was cooperative during assessment.   Chief Complaint:  Chief Complaint  Patient presents with  . Suicidal   Visit Diagnosis: Major depressive disorder  CCA Biopsychosocial Intake/Chief Complaint:  SI with past attempt 2 days ago of cutting himself.  Current Symptoms/Problems: Worsening depressive symptoms.  Patient Reported Schizophrenia/Schizoaffective Diagnosis in Past: No  Strengths: Self-awareness  Preferences: uta  Abilities: uta  Type of Services Patient Feels are Needed: outpatient therapy  Initial Clinical Notes/Concerns: No data recorded  Mental Health Symptoms Depression:  Worthlessness; Hopelessness; Tearfulness; Sleep (too much or little); Fatigue; Difficulty Concentrating; Change in energy/activity; Increase/decrease in appetite   Duration of Depressive symptoms: Greater than two weeks   Mania:  None   Anxiety:   Restlessness; Worrying   Psychosis:  None   Duration of Psychotic symptoms: No data recorded  Trauma:  None   Obsessions:  None   Compulsions:  None   Inattention:  None   Hyperactivity/Impulsivity:  N/A   Oppositional/Defiant Behaviors:  None   Emotional Irregularity:  None   Other Mood/Personality Symptoms:  No data recorded   Mental Status Exam Appearance and self-care  Stature:  Average   Weight:  Obese   Clothing:  Neat/clean   Grooming:  Normal   Cosmetic use:  None   Posture/gait:  Normal   Motor activity:  Not  Remarkable   Sensorium  Attention:  Normal   Concentration:  Normal    Orientation:  X5   Recall/memory:  Normal   Affect and Mood  Affect:  Anxious; Appropriate; Depressed   Mood:  Anxious; Depressed; Hopeless; Worthless   Relating  Eye contact:  Normal   Facial expression:  Anxious; Depressed; Sad   Attitude toward examiner:  Cooperative   Thought and Language  Speech flow: Normal   Thought content:  Appropriate to Mood and Circumstances   Preoccupation:  None   Hallucinations:  None   Organization:  No data recorded  [employer] of Knowledge:  Average   Intelligence:  Average   Abstraction:  Normal   Judgement:  Fair   Reality Testing:  No data recorded  Insight:  Fair   Decision Making:  Impulsive   Social Functioning  Social Maturity:  No data recorded  Social Judgement:  Normal   Stress  Stressors:  Relationship   Coping Ability:  Exhausted; Overwhelmed   Skill Deficits:  Decision making; Self-control   Supports:  Family    Religion:   Leisure/Recreation: Leisure / Recreation Do You Have Hobbies?: Yes Leisure and Hobbies: IT training, video games and watching tv.  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 Has patient ever been in the [employer]?: No  Education: Education Is Patient Currently Attending School?: No Last Grade Completed: 14 Did Garment/textile technologist From [employer]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 What Type of College Degree Do you Have?: 2 years Did [place] Attend Graduate School?: No Did You Have An Individualized Education Program (IIEP): No Did You Have Any Difficulty At [employer]?: No Patient's Education Has Been Impacted  by Current Illness: No  CCA Family/Childhood History Family and Relationship History: Family history Does patient have children?: No  Childhood History:  Childhood History By whom was/is the patient raised?: Both parents Additional childhood history information: raised primarily by mother after 1998 Description of patient's relationship with caregiver when they were a child: close with mother, father worked a lot Does patient have siblings?: Yes Number of Siblings: 1 Description of patient's current relationship with siblings: "very good" Did patient suffer any verbal/emotional/physical/sexual abuse as a child?: No Did patient suffer from severe childhood neglect?: No Has patient ever been sexually abused/assaulted/raped as an adolescent or adult?: No Witnessed domestic violence?: No Has patient been affected by domestic violence as an adult?: No  Child/Adolescent Assessment:   CCA Substance Use Alcohol/Drug Use: Alcohol / Drug Use Pain Medications: see MAR Prescriptions: see MAR Over the Counter: see MAR History of alcohol / drug use?: No history of alcohol / drug abuse   ASAM's:  Six Dimensions of Multidimensional Assessment  Dimension 1:  Acute Intoxication and/or Withdrawal Potential:      Dimension 2:  Biomedical Conditions and Complications:      Dimension 3:  Emotional, Behavioral, or Cognitive Conditions and Complications:     Dimension 4:  Readiness to Change:     Dimension 5:  Relapse, Continued use, or Continued Problem Potential:     Dimension 6:  Recovery/Living Environment:     ASAM Severity Score:    ASAM Recommended Level of Treatment:  Substance use Disorder (SUD)   Recommendations for Services/Supports/Treatments: Recommendations for Services/Supports/Treatments Recommendations For Services/Supports/Treatments: Individual Therapy,Medication Management,Inpatient Hospitalization  DSM5 Diagnoses: Patient Active Problem List   Diagnosis Date Noted   . Elbow pain, chronic, left 02/06/2017  . Low back pain 11/11/2016  . Hematuria, microscopic 08/03/2016  . Fear of high places 01/29/2016  . Major depression, recurrent, chronic (HCC)   . Left knee pain 09/16/2013  . Well adult exam 11/09/2011  . Situational anxiety 11/09/2011  . Obesity 11/09/2011  . Dandruff 11/09/2011  . Facial contusion 06/01/2011  . Vitamin D deficiency 08/25/2009  . ALLERGIC RHINITIS 08/25/2009  . HAND PAIN 08/25/2009    Patient Centered Plan: Patient is on the following Treatment Plan(s):    Referrals to Alternative Service(s): Referred to Alternative Service(s):   Place:   Date:   Time:    Referred to Alternative Service(s):   Place:   Date:   Time:    Referred to Alternative Service(s):   Place:   Date:   Time:    Referred to Alternative Service(s):   Place:   Date:   Time:     Burnetta Sabin, [facility], [place]

## 2020-07-13 NOTE — BH Assessment (Addendum)
Tommy [name], patient accepted to [facility] Room [phone] Attending is Dr. Jola Babinski. Arrival time is 9am. Report [phone] Fayrene Helper, PA, and Lamar Laundry, RN, informed via secure chat.  NOTE: Please complete new set of vitals for patient, prior to arrival at [facility]. Per Tommy [name], at [facility].

## 2020-07-13 NOTE — BH Assessment (Signed)
Disposition Melbourne Abts, PA, patient meets inpatient criteria. Skip Mayer, [facility], patient currently in review for placement at [facility]. Dr. Rubin Payor, Laveda Norman, PA-C and [place], RN, informed via secure chat of patients disposition.

## 2020-07-13 NOTE — ED Notes (Signed)
1 bag of patient belonging placed behind the triage nurses station.

## 2020-07-13 NOTE — ED Provider Notes (Signed)
[facility]-EMERGENCY DEPT Provider Note   CSN: [id number] Arrival date & time: 07/13/20  1520     History Chief Complaint  Patient presents with  . Suicidal    Tommy [name] is a 35 y.o. male.  The history is provided by the patient and medical records. No language interpreter was used.     35 year old male significant history of depression, anxiety, obesity, presents ED with suicidal ideation.  Patient states he recently broke up with 6 years relationship roughly about a month ago.  Since then he has been feeling very depressed as the other person is moving on.  He states he is unable to function, do his job, eat and drink or perform any daily activities due to his ongoing depression.  States that he sleeps a lot, watch TV, quit 2 of his jobs and have been eating much.  He has had persistent thought about harming himself including cutting himself.  He does have access to knives and machete.  He did attempt to make a cut to his abdomen a week ago.  He is here at the urging of his mother who is at bedside to seek for help.  He was on paroxetine but have not been taking it for the past month.  He denies self-medication with alcohol or drugs.  He denies any homicidal ideation auditory or visual hallucination.  He has lost approximately 30 pounds within the month span.  He endorsed feeling lightheadedness from lack of eating  Past Medical History:  Diagnosis Date  . ALLERGIC RHINITIS 08/25/2009   Qualifier: Diagnosis of  By: Plotnikov MD, Georgina Quint   . Anxiety   . HAND PAIN 08/25/2009   Qualifier: Diagnosis of  By: Posey Rea MD, Georgina Quint     Patient Active Problem List   Diagnosis Date Noted  . Elbow pain, chronic, left 02/06/2017  . Low back pain 11/11/2016  . Hematuria, microscopic 08/03/2016  . Fear of high places 01/29/2016  . Major depression, recurrent, chronic (HCC)   . Left knee pain 09/16/2013  . Well adult exam 11/09/2011  . Situational anxiety  11/09/2011  . Obesity 11/09/2011  . Dandruff 11/09/2011  . Facial contusion 06/01/2011  . Vitamin D deficiency 08/25/2009  . ALLERGIC RHINITIS 08/25/2009  . HAND PAIN 08/25/2009    History reviewed. No pertinent surgical history.     Family History  Problem Relation Age of Onset  . Arthritis Mother     Social History   Tobacco Use  . Smoking status: Former Games developer  . Smokeless tobacco: Never Used  Substance Use Topics  . Alcohol use: Yes    Comment: occ  . Drug use: No    Home Medications Prior to Admission medications   Medication Sig Start Date End Date Taking? Authorizing Provider  Cholecalciferol (VITAMIN D3) 50 MCG (2000 UT) capsule Take 1 capsule (2,000 Units total) by mouth daily. 06/23/20   Plotnikov, Georgina Quint, MD  Cyanocobalamin (B-12) 5000 MCG SUBL Take 1 tablet sublingually daily 07/08/20   Plotnikov, Georgina Quint, MD  PARoxetine (PAXIL) 40 MG tablet TAKE 1 TABLET BY MOUTH  DAILY 03/16/19   Plotnikov, Georgina Quint, MD  Vitamin D, Ergocalciferol, (DRISDOL) 1.25 MG (50000 UNIT) CAPS capsule Take 1 capsule (50,000 Units total) by mouth every 7 (seven) days. 06/23/20   Plotnikov, Georgina Quint, MD    Allergies    Patient has no known allergies.  Review of Systems   Review of Systems  All other systems reviewed and  are negative.   Physical Exam Updated Vital Signs BP 98/64   Pulse (!) 111   Temp 98.8 F (37.1 C) (Oral)   Resp 18   Ht 5\' 11"  (1.803 m)   Wt (!) 142 kg   SpO2 99%   BMI 43.65 kg/m   Physical Exam Vitals and nursing note reviewed.  Constitutional:      General: He is not in acute distress.    Appearance: He is well-developed.  HENT:     Head: Atraumatic.  Eyes:     Conjunctiva/sclera: Conjunctivae normal.  Cardiovascular:     Rate and Rhythm: Tachycardia present.     Pulses: Normal pulses.     Heart sounds: Normal heart sounds.  Pulmonary:     Effort: Pulmonary effort is normal.     Breath sounds: Normal breath sounds. No wheezing or  rhonchi.  Abdominal:     Palpations: Abdomen is soft.     Tenderness: There is no abdominal tenderness.  Musculoskeletal:     Cervical back: Neck supple.  Skin:    Findings: No rash.       Neurological:     Mental Status: He is alert and oriented to person, place, and time.     GCS: GCS eye subscore is 4. GCS verbal subscore is 5. GCS motor subscore is 6.  Psychiatric:        Attention and Perception: Attention normal.        Speech: Speech normal.        Behavior: Behavior is cooperative.        Thought Content: Thought content includes suicidal ideation. Thought content does not include homicidal ideation.     ED Results / Procedures / Treatments   Labs (all labs ordered are listed, but only abnormal results are displayed) Labs Reviewed  COMPREHENSIVE METABOLIC PANEL - Abnormal; Notable for the following components:      Result Value   CO2 21 (*)    Glucose, Bld 105 (*)    Creatinine, Ser 1.26 (*)    Total Protein 8.4 (*)    AST 14 (*)    All other components within normal limits  SALICYLATE LEVEL - Abnormal; Notable for the following components:   Salicylate Lvl <7.0 (*)    All other components within normal limits  ACETAMINOPHEN LEVEL - Abnormal; Notable for the following components:   Acetaminophen (Tylenol), Serum <10 (*)    All other components within normal limits  RESP PANEL BY RT-PCR (FLU A&B, COVID) ARPGX2  ETHANOL  CBC  RAPID URINE DRUG SCREEN, HOSP PERFORMED    EKG None  Radiology No results found.  Procedures Procedures   Medications Ordered in ED Medications  acetaminophen (TYLENOL) tablet 650 mg (has no administration in time range)  zolpidem (AMBIEN) tablet 5 mg (has no administration in time range)  ondansetron (ZOFRAN) tablet 4 mg (has no administration in time range)  alum & mag hydroxide-simeth (MAALOX/MYLANTA) 200-200-20 MG/5ML suspension 30 mL (has no administration in time range)  PARoxetine (PAXIL) tablet 40 mg (40 mg Oral Given  07/13/20 1744)  sodium chloride 0.9 % bolus 1,000 mL (1,000 mLs Intravenous New Bag/Given 07/13/20 1745)    ED Course  I have reviewed the triage vital signs and the nursing notes.  Pertinent labs & imaging results that were available during my care of the patient were reviewed by me and considered in my medical decision making (see chart for details).    MDM Rules/Calculators/A&P  BP 98/64   Pulse (!) 111   Temp 98.8 F (37.1 C) (Oral)   Resp 18   Ht 5\' 11"  (1.803 m)   Wt (!) 142 kg   SpO2 99%   BMI 43.65 kg/m   Final Clinical Impression(s) / ED Diagnoses Final diagnoses:  Suicidal ideation    Rx / DC Orders ED Discharge Orders    None     4:54 PM Patient report feeling depressed as well as having suicidal ideation due to recently broken up with a 6-year relationship in which he is having difficult time coping.  He is calm and cooperative.  He is amenable to seek for help.  He was found to be mildly hypotensive with blood pressure of 98/64, and tachycardic with heart rate of 111.  Urinalysis appears concentrated likely reflects dehydration from lack of appetite.  Will give p.o. fluid as well as a liter of IV fluid.  9:31 PM Patient does have evidence of dehydration with a creatinine of 1.26.  He did receive IV fluid in the ED.  He tolerates p.o.  Patient otherwise medically cleared.  TTS and psychiatry has seen and evaluated patient and he does not meet inpatient criteria.  Currently patient will be admitted to [facility] voluntarily.   [facility], PA-C 07/13/20 2132    2133, MD 07/14/20 [phone]

## 2020-07-13 NOTE — ED Triage Notes (Signed)
"  I thought about hurting myself." Patient states he feels stressed, overwhelmed and feels he is in a dark space. Patient last tried Saturday to hurt himself by cutting his abdomen. He says he has tried to hurt himself several times as a way to escape.

## 2020-07-14 ENCOUNTER — Inpatient Hospital Stay (HOSPITAL_COMMUNITY)
Admission: AD | Admit: 2020-07-14 | Discharge: 2020-07-15 | DRG: 885 | Disposition: A | Discharge status: Home / Self Care | Payer: 59 | Attending: Psychiatry | Admitting: Psychiatry

## 2020-07-14 ENCOUNTER — Encounter (HOSPITAL_COMMUNITY): Payer: Self-pay | Admitting: Student

## 2020-07-14 DIAGNOSIS — F418 Other specified anxiety disorders: Secondary | ICD-10-CM

## 2020-07-14 DIAGNOSIS — G4700 Insomnia, unspecified: Secondary | ICD-10-CM | POA: Diagnosis present

## 2020-07-14 DIAGNOSIS — R45851 Suicidal ideations: Secondary | ICD-10-CM | POA: Diagnosis not present

## 2020-07-14 DIAGNOSIS — Z20822 Contact with and (suspected) exposure to covid-19: Secondary | ICD-10-CM | POA: Diagnosis present

## 2020-07-14 DIAGNOSIS — Z79899 Other long term (current) drug therapy: Secondary | ICD-10-CM

## 2020-07-14 DIAGNOSIS — F332 Major depressive disorder, recurrent severe without psychotic features: Principal | ICD-10-CM | POA: Diagnosis present

## 2020-07-14 DIAGNOSIS — Z87891 Personal history of nicotine dependence: Secondary | ICD-10-CM

## 2020-07-14 MED ORDER — TRAZODONE HCL 50 MG PO TABS: 50.0000 mg | ORAL_TABLET | Freq: Every evening | ORAL | Status: DC | PRN

## 2020-07-14 MED ORDER — PAROXETINE HCL 20 MG PO TABS
40.0000 mg | ORAL_TABLET | Freq: Every day | ORAL | Status: DC
  Administered 2020-07-15: 40 mg via ORAL
  Filled 2020-07-14 (×3): qty 2

## 2020-07-14 MED ORDER — HYDROXYZINE HCL 25 MG PO TABS: 25.0000 mg | ORAL_TABLET | Freq: Three times a day (TID) | ORAL | Status: DC | PRN

## 2020-07-14 MED ORDER — MAGNESIUM HYDROXIDE 400 MG/5ML PO SUSP: 30.0000 mL | Freq: Every day | ORAL | Status: DC | PRN

## 2020-07-14 MED ORDER — VITAMIN D 25 MCG (1000 UNIT) PO TABS
2000.0000 [IU] | ORAL_TABLET | Freq: Every day | ORAL | Status: DC
  Administered 2020-07-14 – 2020-07-15 (×2): 2000 [IU] via ORAL
  Filled 2020-07-14 (×5): qty 2

## 2020-07-14 MED ORDER — ALUM & MAG HYDROXIDE-SIMETH 200-200-20 MG/5ML PO SUSP: 30.0000 mL | ORAL | Status: DC | PRN

## 2020-07-14 MED ORDER — ACETAMINOPHEN 325 MG PO TABS: 650.0000 mg | ORAL_TABLET | Freq: Four times a day (QID) | ORAL | Status: DC | PRN

## 2020-07-14 NOTE — Progress Notes (Signed)
Recreation Therapy Notes  Animal-Assisted Activity (AAA) Program Checklist/Progress Notes Patient Eligibility Criteria Checklist & Daily Group note for Rec Tx Intervention  Date: 5.3.22 Time: 1430 Location: [address]   AAA/T Program Assumption of Risk Form signed by Engineer, production or Parent Legal Guardian  YES  Patient is free of allergies or severe asthma  YES   Patient reports no fear of animals  YES  Patient reports no history of cruelty to animals YES   Patient understands his/her participation is voluntary  YES   Patient washes hands before animal contact  YES   Patient washes hands after animal contact  YES  Behavioral Response: Engaged  Education: Charity fundraiser, Appropriate Animal Interaction   Education Outcome: Acknowledges understanding/In group clarification offered/Needs additional education.   Clinical Observations/Feedback: Pt attended and participated in group activity.    Caroll Rancher, LRT/CTRS         Caroll Rancher A 07/14/2020 3:45 PM

## 2020-07-14 NOTE — Tx Team (Signed)
Initial Treatment Plan 07/14/2020 2:37 PM Tommy [name] FAO:[id number]    PATIENT STRESSORS: Educational concerns Loss of significant relationship (Recent breakup from girlfriend >4 yrs) Medication change or noncompliance   PATIENT STRENGTHS: Ability for insight Capable of independent living Licensed conveyancer Motivation for treatment/growth Physical Health Religious Affiliation Supportive family/friends Work skills   PATIENT IDENTIFIED PROBLEMS: Alterations in mood "It been more of sadness since I broke up with my girlfriend. I get sad & depressed".    Alterations in appetite "I had no appetite for anything. I have lost 30 lbs since the breakup".    Alterations in sleep pattern "I sleep for 4-5 hours the most at night for the last 2 months now".    Medication noncompliance "I had stopped taking the Paxil but after all this I have started taking it again".         DISCHARGE CRITERIA:  Improved stabilization in mood, thinking, and/or behavior Verbal commitment to aftercare and medication compliance  PRELIMINARY DISCHARGE PLAN: Outpatient therapy Return to previous living arrangement Return to previous work or school arrangements  PATIENT/FAMILY INVOLVEMENT: This treatment plan has been presented to and reviewed with the patient, Tommy [name] .The patient have been given the opportunity to ask questions and make suggestions.  Sherryl Manges, RN 07/14/2020, 2:37 PM

## 2020-07-14 NOTE — H&P (Signed)
Psychiatric Admission Assessment Adult  Patient Identification: Tommy [name] MRN:  [id number] Date of Evaluation:  07/14/2020 Chief Complaint:  MDD (major depressive disorder), recurrent severe, without psychosis (HCC) [F33.2] Principal Diagnosis: <principal problem not specified> Diagnosis:  Active Problems:   MDD (major depressive disorder), recurrent severe, without psychosis (HCC)  History of Present Illness: Patient is seen and examined.  Patient is a 35 year old male with a past psychiatric history significant for anxiety and depression who originally presented to his primary care provider on 07/08/2020 for evaluation.  He had apparently presented to a local emergency department on 07/03/2020 secondary to suicidal ideation.  The patient stated that he had had a recent break-up.  He stated that it was very painful for him.  He talked about the fact that the girlfriend had already gotten involved with another male and that was very hurtful to him.  He had spoken to a friend a couple of weeks ago, who was quite concerned about his demeanor.  The patient is from [facility], but his friend transported him to the [facility] hospital in [place] on 07/03/2020.  He was evaluated there, and given referrals for outpatient psychotherapy.  The patient admitted that after he had been discharged from the hospital he had not followed up and contacted any of the therapist.  He stated that his primary objective is to get into therapy and to work through these problems.  As per above, he was seen by his primary care provider on 07/08/2020.  The patient described feeling overwhelmed over the last several weeks with vague suicidal ideation.  The patient has been on paroxetine 40 mg a day for couple years.  This was initially started for premature ejaculation.  He did admit that he has episodes of anxiety and a fast heart rate, and that it has helped with that.  He was given referrals for psychotherapy, but the patient was  really unable to move forward with that.  He then presented to the [facility] emergency department on 07/13/2020 with suicidal ideation.  He again admitted that he was feeling stressed, overwhelmed and was in "a dark place".  He had attempted to harm himself the Saturday prior to admission by cutting his abdomen superficially.  He was seen by the comprehensive clinical assessment team on 07/13/2020.  The decision was made to admit him to the hospital for evaluation and stabilization.  The patient denied any previous psychiatric treatment outside of the Paxil.  He denied any previous psychiatric hospitalizations.  He denied any previous suicide attempts.  He denied any family history of any suicide attempts.  No drugs or alcohol that he admitted to.  Associated Signs/Symptoms: Depression Symptoms:  depressed mood, anhedonia, insomnia, psychomotor agitation, fatigue, feelings of worthlessness/guilt, difficulty concentrating, hopelessness, suicidal thoughts without plan, anxiety, panic attacks, loss of energy/fatigue, disturbed sleep, Duration of Depression Symptoms: Greater than two weeks  (Hypo) Manic Symptoms:  Impulsivity, Irritable Mood, Labiality of Mood, Anxiety Symptoms:  Excessive Worry, Psychotic Symptoms:  Denied PTSD Symptoms: Negative Total Time spent with patient: 30 minutes  Past Psychiatric History: Patient has been treated with 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  He did admit that it helped his anxiety symptoms.  No previous psychiatric admissions, no previous psychiatric evaluations.  Is the patient at risk to self? Yes.    Has the patient been a risk to self in the past 6 months? No.  Has the patient been a risk to self within the  distant past? No.  Is the patient a risk to others? No.  Has the patient been a risk to others in the past 6 months? No.  Has the patient been a risk to others within the distant past?  No.   Prior Inpatient Therapy:   Prior Outpatient Therapy:    Alcohol Screening:   Substance Abuse History in the last 12 months:  No. Consequences of Substance Abuse: Negative Previous Psychotropic Medications: Yes  Psychological Evaluations: No  Past Medical History:  Past Medical History:  Diagnosis Date  . ALLERGIC RHINITIS 08/25/2009   Qualifier: Diagnosis of  By: Plotnikov MD, Georgina Quint   . Anxiety   . HAND PAIN 08/25/2009   Qualifier: Diagnosis of  By: Plotnikov MD, Georgina Quint    No past surgical history on file. Family History:  Family History  Problem Relation Age of Onset  . Arthritis Mother    Family Psychiatric  History: Patient denied any family history of psychiatric illness or suicide. Tobacco Screening:   Social History:  Social History   Substance and Sexual Activity  Alcohol Use Yes   Comment: occ     Social History   Substance and Sexual Activity  Drug Use No    Additional Social History:                           Allergies:  No Known Allergies Lab Results:  Results for orders placed or performed during the hospital encounter of 07/13/20 (from the past 48 hour(s))  Comprehensive metabolic panel     Status: Abnormal   Collection Time: 07/13/20  4:32 PM  Result Value Ref Range   Sodium 136 135 - 145 mmol/L   Potassium 3.8 3.5 - 5.1 mmol/L   Chloride 102 98 - 111 mmol/L   CO2 21 (L) 22 - 32 mmol/L   Glucose, Bld 105 (H) 70 - 99 mg/dL    Comment: Glucose reference range applies only to samples taken after fasting for at least 8 hours.   BUN 17 6 - 20 mg/dL   Creatinine, Ser 4.09 (H) 0.61 - 1.24 mg/dL   Calcium 81.1 8.9 - 91.4 mg/dL   Total Protein 8.4 (H) 6.5 - 8.1 g/dL   Albumin 4.9 3.5 - 5.0 g/dL   AST 14 (L) 15 - 41 U/L   ALT 14 0 - 44 U/L   Alkaline Phosphatase 43 38 - 126 U/L   Total Bilirubin 0.8 0.3 - 1.2 mg/dL   GFR, Estimated >78 >29 mL/min    Comment: (NOTE) Calculated using the CKD-EPI Creatinine Equation (2021)     Anion gap 13 5 - 15    Comment: Performed at [facility], [address] [place], [place] [address]  Ethanol     Status: None   Collection Time: 07/13/20  4:32 PM  Result Value Ref Range   Alcohol, Ethyl (B) <10 <10 mg/dL    Comment: (NOTE) Lowest detectable limit for serum alcohol is 10 mg/dL.  For medical purposes only. Performed at [facility], [address], [place], [place] [address]   Salicylate level     Status: Abnormal   Collection Time: 07/13/20  4:32 PM  Result Value Ref Range   Salicylate Lvl <7.0 (L) 7.0 - 30.0 mg/dL    Comment: Performed at [facility], [address] [place], [place] [address]  Acetaminophen level     Status: Abnormal   Collection  Time: 07/13/20  4:32 PM  Result Value Ref Range   Acetaminophen (Tylenol), Serum <10 (L) 10 - 30 ug/mL    Comment: (NOTE) Therapeutic concentrations vary significantly. A range of 10-30 ug/mL  may be an effective concentration for many patients. However, some  are best treated at concentrations outside of this range. Acetaminophen concentrations >150 ug/mL at 4 hours after ingestion  and >50 ug/mL at 12 hours after ingestion are often associated with  toxic reactions.  Performed at [facility], [address] [place], [place] [address]   cbc     Status: None   Collection Time: 07/13/20  4:32 PM  Result Value Ref Range   WBC 6.7 4.0 - 10.5 K/uL   RBC 5.44 4.22 - 5.81 MIL/uL   Hemoglobin 16.1 13.0 - 17.0 g/dL   HCT 60.448.8 54.039.0 - 98.152.0 %   MCV 89.7 80.0 - 100.0 fL   MCH 29.6 26.0 - 34.0 pg   MCHC 33.0 30.0 - 36.0 g/dL   RDW 19.113.1 47.811.5 - 29.515.5 %   Platelets 364 150 - 400 K/uL   nRBC 0.0 0.0 - 0.2 %    Comment: Performed at [facility], [address] [place], [place] [address]  Rapid urine drug screen (hospital performed)     Status: None   Collection Time: 07/13/20  5:57 PM  Result Value Ref Range   Opiates NONE  DETECTED NONE DETECTED   Cocaine NONE DETECTED NONE DETECTED   Benzodiazepines NONE DETECTED NONE DETECTED   Amphetamines NONE DETECTED NONE DETECTED   Tetrahydrocannabinol NONE DETECTED NONE DETECTED   Barbiturates NONE DETECTED NONE DETECTED    Comment: (NOTE) DRUG SCREEN FOR MEDICAL PURPOSES ONLY.  IF CONFIRMATION IS NEEDED FOR ANY PURPOSE, NOTIFY LAB WITHIN 5 DAYS.  LOWEST DETECTABLE LIMITS FOR URINE DRUG SCREEN Drug Class                     Cutoff (ng/mL) Amphetamine and metabolites    1000 Barbiturate and metabolites    200 Benzodiazepine                 200 Tricyclics and metabolites     300 Opiates and metabolites        300 Cocaine and metabolites        300 THC                            50 Performed at [facility], [address] [place], [place] [address]   Resp Panel by RT-PCR (Flu A&B, Covid) Nasopharyngeal Swab     Status: None   Collection Time: 07/13/20  5:57 PM   Specimen: Nasopharyngeal Swab; Nasopharyngeal(NP) swabs in vial transport medium  Result Value Ref Range   SARS Coronavirus 2 by RT PCR NEGATIVE NEGATIVE    Comment: (NOTE) SARS-CoV-2 target nucleic acids are NOT DETECTED.  The SARS-CoV-2 RNA is generally detectable in upper respiratory specimens during the acute phase of infection. The lowest concentration of SARS-CoV-2 viral copies this assay can detect is 138 copies/mL. A negative result does not preclude SARS-Cov-2 infection and should not be used as the sole basis for treatment or other patient management decisions. A negative result may occur with  improper specimen collection/handling, submission of specimen other than nasopharyngeal swab, presence of viral mutation(s) within the areas targeted by this assay, and inadequate number of viral copies(<138 copies/mL). A negative result must  be combined with clinical observations, patient history, and epidemiological information. The expected result is Negative.  Fact  Sheet for Patients:  BloggerCourse.com  Fact Sheet for Healthcare Providers:  SeriousBroker.it  This test is no t yet approved or cleared by the [place] FDA and  has been authorized for detection and/or diagnosis of SARS-CoV-2 by FDA under an Emergency Use Authorization (EUA). This EUA will remain  in effect (meaning this test can be used) for the duration of the COVID-19 declaration under Section 564(b)(1) of the Act, 21 U.S.C.section 360bbb-3(b)(1), unless the authorization is terminated  or revoked sooner.       Influenza A by PCR NEGATIVE NEGATIVE   Influenza B by PCR NEGATIVE NEGATIVE    Comment: (NOTE) The Xpert Xpress SARS-CoV-2/FLU/RSV plus assay is intended as an aid in the diagnosis of influenza from Nasopharyngeal swab specimens and should not be used as a sole basis for treatment. Nasal washings and aspirates are unacceptable for Xpert Xpress SARS-CoV-2/FLU/RSV testing.  Fact Sheet for Patients: BloggerCourse.com  Fact Sheet for Healthcare Providers: SeriousBroker.it  This test is not yet approved or cleared by the [place] FDA and has been authorized for detection and/or diagnosis of SARS-CoV-2 by FDA under an Emergency Use Authorization (EUA). This EUA will remain in effect (meaning this test can be used) for the duration of the COVID-19 declaration under Section 564(b)(1) of the Act, 21 U.S.C. section 360bbb-3(b)(1), unless the authorization is terminated or revoked.  Performed at [facility], [address] [place], [place] [address]     Blood Alcohol level:  Lab Results  Component Value Date   [facility] <10 07/13/2020   ETH <11 01/28/2014    Metabolic Disorder Labs:  No results found for: HGBA1C, MPG No results found for: PROLACTIN Lab Results  Component Value Date   CHOL 161 06/23/2020   TRIG 64.0 06/23/2020   HDL 50.80  06/23/2020   CHOLHDL 3 06/23/2020   VLDL 12.8 06/23/2020   LDLCALC 97 06/23/2020   LDLCALC 93 03/01/2018    Current Medications: Current Facility-Administered Medications  Medication Dose Route Frequency Provider Last Rate Last Admin  . acetaminophen (TYLENOL) tablet 650 mg  650 mg Oral Q6H PRN Antonieta Pert, MD      . alum & mag hydroxide-simeth (MAALOX/MYLANTA) 200-200-20 MG/5ML suspension 30 mL  30 mL Oral Q4H PRN Antonieta Pert, MD      . cholecalciferol (VITAMIN D3) tablet 2,000 Units  2,000 Units Oral Daily Antonieta Pert, MD   2,000 Units at 07/14/20 1157  . hydrOXYzine (ATARAX/VISTARIL) tablet 25 mg  25 mg Oral TID PRN Antonieta Pert, MD      . magnesium hydroxide (MILK OF MAGNESIA) suspension 30 mL  30 mL Oral Daily PRN Antonieta Pert, MD      . Melene Muller ON 07/15/2020] PARoxetine (PAXIL) tablet 40 mg  40 mg Oral Daily Antonieta Pert, MD      . traZODone (DESYREL) tablet 50 mg  50 mg Oral QHS PRN Antonieta Pert, MD       PTA Medications: Medications Prior to Admission  Medication Sig Dispense Refill Last Dose  . Cholecalciferol (VITAMIN D3) 50 MCG (2000 UT) capsule Take 1 capsule (2,000 Units total) by mouth daily. 100 capsule 3   . Cyanocobalamin (B-12) 5000 MCG SUBL Take 1 tablet sublingually daily 30 tablet 1   . PARoxetine (PAXIL) 40 MG tablet TAKE 1 TABLET BY MOUTH  DAILY (Patient taking differently: Take 40  mg by mouth daily.) 90 tablet 1   . Vitamin D, Ergocalciferol, (DRISDOL) 1.25 MG (50000 UNIT) CAPS capsule Take 1 capsule (50,000 Units total) by mouth every 7 (seven) days. 6 capsule 0     Musculoskeletal: Strength & Muscle Tone: within normal limits Gait & Station: normal Patient leans: N/A            Psychiatric Specialty Exam:  Presentation  General Appearance: Casual  Eye Contact:Fair  Speech:Normal Rate  Speech Volume:Decreased  Handedness:Right   Mood and Affect  Mood:Depressed  Affect:Appropriate   Thought  Process  Thought Processes:Coherent  Duration of Psychotic Symptoms: No data recorded Past Diagnosis of Schizophrenia or Psychoactive disorder: No  Descriptions of Associations:Intact  Orientation:Full (Time, Place and Person)  Thought Content:Logical  Hallucinations:Hallucinations: None  Ideas of Reference:None  Suicidal Thoughts:Suicidal Thoughts: Yes, Passive  Homicidal Thoughts:Homicidal Thoughts: No   Sensorium  Memory:Immediate Good; Recent Good; Remote Good  Judgment:Good  Insight:Good   Executive Functions  Concentration:Good  Attention Span:Good  Recall:Good  Fund of Knowledge:Good  Language:Good   Psychomotor Activity  Psychomotor Activity:Psychomotor Activity: Normal   Assets  Assets:Communication Skills; Desire for Improvement; Resilience; Social Support; Health and safety inspector; Housing; Talents/Skills; Transportation; Vocational/Educational   Sleep  Sleep:Sleep: Fair    Physical Exam: Physical Exam Vitals and nursing note reviewed.  Constitutional:      Appearance: Normal appearance.  HENT:     Head: Normocephalic and atraumatic.  Pulmonary:     Effort: Pulmonary effort is normal.  Neurological:     General: No focal deficit present.     Mental Status: He is alert and oriented to person, place, and time.    ROS There were no vitals taken for this visit. There is no height or weight on file to calculate BMI.  Treatment Plan Summary: Daily contact with patient to assess and evaluate symptoms and progress in treatment, Medication management and Plan : Patient is seen and examined.  Patient is a 35 year old male with the above-stated past psychiatric history who is admitted to the hospital for evaluation and stabilization.  He will be admitted to the hospital.  He will be integrated in the milieu.  He will be encouraged to attend groups.  We have discussed possibly switching his Paxil.  We discussed the fact that he would  probably only be in the hospital for 1 to 3 days.  His preference is to continue the Paxil for now.  He has had problems with discontinuation when he is misted in the past.  We discussed having available hydroxyzine for anxiety as well as trazodone for sleep.  He is already spoken with his mother who lives in the [place] area.  She is supportive and glad that he is in the hospital.  He denies current suicidal ideation at this time.  Review of his admission laboratories revealed a mildly elevated creatinine at 1.26.  Previously it was normal.  Liver function enzymes were normal.  His lipid panel from 4/12 showed an elevated cholesterol at 161.  His vitamin D is normal at 26.64.  Vitamin B-12 was 144.  CBC was normal including platelets.  Differential was normal.  Acetaminophen was less than 10, salicylate less than 7.  TSH was normal at 1.02.  Respiratory panel for influenza A, B and coronavirus were negative.  Blood alcohol was less than 10.  Drug screen was negative.  His vital signs are stable, he is afebrile.  Observation Level/Precautions:  15 minute checks  Laboratory:  Chemistry Profile  Psychotherapy:    Medications:    Consultations:    Discharge Concerns:    Estimated LOS:  Other:     Physician Treatment Plan for Primary Diagnosis: <principal problem not specified> Long Term Goal(s): Improvement in symptoms so as ready for discharge  Short Term Goals: Ability to identify changes in lifestyle to reduce recurrence of condition will improve, Ability to verbalize feelings will improve, Ability to disclose and discuss suicidal ideas, Ability to demonstrate self-control will improve, Ability to identify and develop effective coping behaviors will improve and Ability to maintain clinical measurements within normal limits will improve  Physician Treatment Plan for Secondary Diagnosis: Active Problems:   MDD (major depressive disorder), recurrent severe, without psychosis (HCC)  Long Term  Goal(s): Improvement in symptoms so as ready for discharge  Short Term Goals: Ability to identify changes in lifestyle to reduce recurrence of condition will improve, Ability to verbalize feelings will improve, Ability to disclose and discuss suicidal ideas, Ability to demonstrate self-control will improve, Ability to identify and develop effective coping behaviors will improve and Ability to maintain clinical measurements within normal limits will improve  I certify that inpatient services furnished can reasonably be expected to improve the patient's condition.    Antonieta Pert, MD 5/3/202212:06 PM

## 2020-07-14 NOTE — ED Notes (Signed)
Safe transport called 
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
## 2020-07-14 NOTE — Progress Notes (Addendum)
   07/14/20 2015  Psych Admission Type (Psych Patients Only)  Admission Status Voluntary  Psychosocial Assessment  Patient Complaints Sadness  Eye Contact Fair  Facial Expression Flat;Worried  Affect Appropriate to circumstance  Speech Logical/coherent;Soft  Interaction Assertive;Cautious  Motor Activity Other (Comment) (wnl)  Appearance/Hygiene Unremarkable  Behavior Characteristics Cooperative  Mood Sad;Pleasant  Thought Process  Coherency Concrete thinking  Content WDL  Delusions None reported or observed  Perception WDL  Hallucination None reported or observed  Judgment Poor  Confusion None  Danger to Self  Current suicidal ideation? Denies  Danger to Others  Danger to Others None reported or observed   Pt seen in dayroom. Pt denies SI, HI, AVH and pain. "What is the procedure for things here?" Pt told that he will speak with a provider daily to assess his needs and the LOS. "I'm not suicidal now. I know my mom brought me here and at the time, it was the right call because of how I was acting. I was just overwhelmed by my situation. What I do need now is a therapist. I'm gonna need someone to talk to outside of here." Pt told to speak with a SW about his therapy needs and resources.

## 2020-07-14 NOTE — [facility] Suicide Risk Assessment (Signed)
[facility] Admission Suicide Risk Assessment   Nursing information obtained from:    Demographic factors:    Current Mental Status:    Loss Factors:    Historical Factors:    Risk Reduction Factors:     Total Time spent with patient: 30 minutes Principal Problem: <principal problem not specified> Diagnosis:  Active Problems:   MDD (major depressive disorder), recurrent severe, without psychosis (HCC)  Subjective Data: Patient is seen and examined.  Patient is a 35 year old male with a past psychiatric history significant for anxiety and depression who originally presented to his primary care provider on 07/08/2020 for evaluation.  He had apparently presented to a local emergency department on 07/03/2020 secondary to suicidal ideation.  The patient stated that he had had a recent break-up.  He stated that it was very painful for him.  He talked about the fact that the girlfriend had already gotten involved with another male and that was very hurtful to him.  He had spoken to a friend a couple of weeks ago, who was quite concerned about his demeanor.  The patient is from [facility], but his friend transported him to the [facility] hospital in [place] on 07/03/2020.  He was evaluated there, and given referrals for outpatient psychotherapy.  The patient admitted that after he had been discharged from the hospital he had not followed up and contacted any of the therapist.  He stated that his primary objective is to get into therapy and to work through these problems.  As per above, he was seen by his primary care provider on 07/08/2020.  The patient described feeling overwhelmed over the last several weeks with vague suicidal ideation.  The patient has been on paroxetine 40 mg a day for couple years.  This was initially started for premature ejaculation.  He did admit that he has episodes of anxiety and a fast heart rate, and that it has helped with that.  He was given referrals for psychotherapy, but the patient was  really unable to move forward with that.  He then presented to the [facility] emergency department on 07/13/2020 with suicidal ideation.  He again admitted that he was feeling stressed, overwhelmed and was in "a dark place".  He had attempted to harm himself the Saturday prior to admission by cutting his abdomen superficially.  He was seen by the comprehensive clinical assessment team on 07/13/2020.  The decision was made to admit him to the hospital for evaluation and stabilization.  The patient denied any previous psychiatric treatment outside of the Paxil.  He denied any previous psychiatric hospitalizations.  He denied any previous suicide attempts.  He denied any family history of any suicide attempts.  No drugs or alcohol that he admitted to.  Continued Clinical Symptoms:    The "Alcohol Use Disorders Identification Test", Guidelines for Use in Primary Care, Second Edition.  World Science writer [facility]). Score between 0-7:  no or low risk or alcohol related problems. Score between 8-15:  moderate risk of alcohol related problems. Score between 16-19:  high risk of alcohol related problems. Score 20 or above:  warrants further diagnostic evaluation for alcohol dependence and treatment.   CLINICAL FACTORS:   Severe Anxiety and/or Agitation Depression:   Anhedonia Hopelessness Impulsivity Insomnia   Musculoskeletal: Strength & Muscle Tone: within normal limits Gait & Station: normal Patient leans: N/A  Psychiatric Specialty Exam:  Presentation  General Appearance: Casual  Eye Contact:Fair  Speech:Normal Rate  Speech Volume:Decreased  Handedness:Right  Mood and Affect  Mood:Depressed  Affect:Appropriate   Thought Process  Thought Processes:Coherent  Descriptions of Associations:Intact  Orientation:Full (Time, Place and Person)  Thought Content:Logical  History of Schizophrenia/Schizoaffective disorder:No  Duration of Psychotic Symptoms:No  data recorded Hallucinations:Hallucinations: None  Ideas of Reference:None  Suicidal Thoughts:Suicidal Thoughts: Yes, Passive  Homicidal Thoughts:Homicidal Thoughts: No   Sensorium  Memory:Immediate Good; Recent Good; Remote Good  Judgment:Good  Insight:Good   Executive Functions  Concentration:Good  Attention Span:Good  Recall:Good  Fund of Knowledge:Good  Language:Good   Psychomotor Activity  Psychomotor Activity:Psychomotor Activity: Normal   Assets  Assets:Communication Skills; Desire for Improvement; Resilience; Social Support; Health and safety inspector; Housing; Talents/Skills; Transportation; Vocational/Educational   Sleep  Sleep:Sleep: Fair    Physical Exam: Physical Exam Vitals and nursing note reviewed.  Constitutional:      Appearance: Normal appearance.  HENT:     Head: Normocephalic and atraumatic.  Pulmonary:     Effort: Pulmonary effort is normal.  Neurological:     General: No focal deficit present.     Mental Status: He is alert and oriented to person, place, and time.    ROS There were no vitals taken for this visit. There is no height or weight on file to calculate BMI.   COGNITIVE FEATURES THAT CONTRIBUTE TO RISK:  None    SUICIDE RISK:   Mild:  Suicidal ideation of limited frequency, intensity, duration, and specificity.  There are no identifiable plans, no associated intent, mild dysphoria and related symptoms, good self-control (both objective and subjective assessment), few other risk factors, and identifiable protective factors, including available and accessible social support.  PLAN OF CARE: Patient is seen and examined.  Patient is a 35 year old male with the above-stated past psychiatric history who is admitted to the hospital for evaluation and stabilization.  He will be admitted to the hospital.  He will be integrated in the milieu.  He will be encouraged to attend groups.  We have discussed possibly switching his  Paxil.  We discussed the fact that he would probably only be in the hospital for 1 to 3 days.  His preference is to continue the Paxil for now.  He has had problems with discontinuation when he is misted in the past.  We discussed having available hydroxyzine for anxiety as well as trazodone for sleep.  He is already spoken with his mother who lives in the [place] area.  She is supportive and glad that he is in the hospital.  He denies current suicidal ideation at this time.  Review of his admission laboratories revealed a mildly elevated creatinine at 1.26.  Previously it was normal.  Liver function enzymes were normal.  His lipid panel from 4/12 showed an elevated cholesterol at 161.  His vitamin D is normal at 26.64.  Vitamin B-12 was 144.  CBC was normal including platelets.  Differential was normal.  Acetaminophen was less than 10, salicylate less than 7.  TSH was normal at 1.02.  Respiratory panel for influenza A, B and coronavirus were negative.  Blood alcohol was less than 10.  Drug screen was negative.  His vital signs are stable, he is afebrile.  I certify that inpatient services furnished can reasonably be expected to improve the patient's condition.   Antonieta Pert, MD 07/14/2020, 11:34 AM

## 2020-07-15 MED ORDER — TRAZODONE HCL 50 MG PO TABS: 50.0000 mg | ORAL_TABLET | Freq: Every evening | ORAL | 0 refills | Status: DC | PRN

## 2020-07-15 MED ORDER — PAROXETINE HCL 40 MG PO TABS: 40.0000 mg | ORAL_TABLET | Freq: Every day | ORAL | 0 refills | Status: DC

## 2020-07-15 MED ORDER — HYDROXYZINE HCL 25 MG PO TABS: 25.0000 mg | ORAL_TABLET | Freq: Three times a day (TID) | ORAL | 0 refills | Status: DC | PRN

## 2020-07-15 MED ORDER — ADULT MULTIVITAMIN W/MINERALS CH: 1.0000 | ORAL_TABLET | Freq: Every day | ORAL | 0 refills | Status: DC

## 2020-07-15 MED ORDER — ADULT MULTIVITAMIN W/MINERALS CH
1.0000 | ORAL_TABLET | Freq: Every day | ORAL | Status: DC
  Administered 2020-07-15: 1 via ORAL
  Filled 2020-07-15 (×3): qty 1

## 2020-07-15 NOTE — [facility] Suicide Risk Assessment (Signed)
[facility] INPATIENT:  Family/Significant Other Suicide Prevention Education  Suicide Prevention Education:  Contact Attempts: Tommy [name] [phone] (Mother) has been identified by the patient as the family member/significant other with whom the patient will be residing, and identified as the person(s) who will aid the patient in the event of a mental health crisis.  With written consent from the patient, two attempts were made to provide suicide prevention education, prior to and/or following the patient's discharge.  We were unsuccessful in providing suicide prevention education.  A suicide education pamphlet was given to the patient to share with family/significant other.  Date and time of first attempt: 07/15/2020 at 11:50am CSW was unable to reach the patient's family member before he was discharged from the hospital.     Aram Beecham 07/15/2020, 2:53 PM

## 2020-07-15 NOTE — Progress Notes (Signed)
Discharge Note:  Patient denies SI/HI AVH at this time. Discharge instructions, AVS, prescriptions and transition record gone over with patient. Patient agrees to comply with medication management, follow-up visit, and outpatient therapy. Patient belongings returned to patient. Patient questions and concerns addressed and answered.  Patient ambulatory off unit.  Patient discharged to home with mom.

## 2020-07-15 NOTE — B[facility] Group Notes (Signed)
Type of Therapy and Topic:  Group Therapy:  Setting Goals   Participation Level:  Active   Description of Group: In this process group, patients discussed using strengths to work toward goals and address challenges.  Patients identified two positive things about themselves and one goal they were working on.  Patients were given the opportunity to share openly and support each other's plan for self-empowerment.  The group discussed the value of gratitude and were encouraged to have a daily reflection of positive characteristics or circumstances.  Patients were encouraged to identify a plan to utilize their strengths to work on current challenges and goals.   Therapeutic Goals 1. Patient will verbalize personal strengths/positive qualities and relate how these can assist with achieving desired personal goals 2. Patients will verbalize affirmation of peers plans for personal change and goal setting 3. Patients will explore the value of gratitude and positive focus as related to successful achievement of goals 4. Patients will verbalize a plan for regular reinforcement of personal positive qualities and circumstances.   Summary of Patient Progress: Patient identified the definition of goals. Patients was given the opportunity to share openly and support other group members' plan for self-empowerment. Patient verbalized personal strength and how they relate to achieving the desired goal. Patient was able to identify positive goals to work towards when he returns home.  

## 2020-07-15 NOTE — Progress Notes (Signed)
NUTRITION ASSESSMENT RD working remotely.   Pt identified as at risk on the Malnutrition Screen Tool  INTERVENTION: - will order 1 tablet multivitamin with minerals/day. [facility] staff to continue to encourage PO intakes of meals and snacks.    NUTRITION DIAGNOSIS: Unintentional weight loss related to sub-optimal intake as evidenced by pt report.   Goal: Pt to meet >/= 90% of their estimated nutrition needs.  Monitor:  PO intake  Assessment:  Patient with psychiatric hx of anxiety and depression. He presented to the ED on 07/03/20 d/t SI after a recent break-up. He was discharged from the ED on the same day and returned to the ED on 07/13/20 with ongoing SI. He superficially cut his stomach on 4/30.  He reported to ED staff that he has lost 30 lb; time frame not documented.   Weight yesterday was documented as 310 lb (appears to be a stated weight) and weight on 06/23/20 was 329 lb. This indicates 19 lb weight loss (5.8% body weight) in 1 month; significant for time frame.    35 y.o. male  Height: Ht Readings from Last 1 Encounters:  07/14/20 5' 10.87" (1.8 m)    Weight: Wt Readings from Last 1 Encounters:  07/14/20 (!) 140.6 kg    Weight Hx: Wt Readings from Last 10 Encounters:  07/14/20 (!) 140.6 kg  07/13/20 (!) 142 kg  07/08/20 (!) 142.2 kg  06/23/20 (!) 149.1 kg  03/01/18 (!) 162.4 kg  02/06/17 (!) 161.5 kg  11/11/16 (!) 156 kg  08/02/16 (!) 157.9 kg  01/29/16 (!) 151.5 kg  07/31/15 (!) 147.4 kg    BMI:  Body mass index is 43.4 kg/m. Pt meets criteria for morbid obesity based on current BMI.  Estimated Nutritional Needs: Kcal: 25-30 kcal/kg Protein: > 1 gram protein/kg Fluid: 1 ml/kcal  Diet Order:  Diet Order            Diet regular Room service appropriate? Yes; Fluid consistency: Thin  Diet effective now                Pt is also offered choice of unit snacks mid-morning and mid-afternoon.  Pt is eating as desired.   Lab results and  medications reviewed.      Trenton Gammon, MS, RD, LDN, CNSC Inpatient Clinical Dietitian RD pager # available in [facility]  After hours/weekend pager # available in [facility]

## 2020-07-15 NOTE — [facility] Suicide Risk Assessment (Signed)
[facility] Discharge Suicide Risk Assessment   Principal Problem: MDD (major depressive disorder), recurrent severe, without psychosis (HCC) Discharge Diagnoses: Principal Problem:   MDD (major depressive disorder), recurrent severe, without psychosis (HCC)   Total Time spent with patient: 20 minutes  Musculoskeletal: Strength & Muscle Tone: within normal limits Gait & Station: normal Patient leans: N/A  Psychiatric Specialty Exam: Review of Systems  All other systems reviewed and are negative.   Blood pressure 103/63, pulse 88, temperature (!) 97.5 F (36.4 C), temperature source Oral, resp. rate 20, height 5' 10.87" (1.8 m), weight (!) 140.6 kg, SpO2 100 %.Body mass index is 43.4 kg/m.  General Appearance: Casual  Eye Contact::  Good  Speech:  Normal Rate409  Volume:  Normal  Mood:  Anxious  Affect:  Congruent  Thought Process:  Coherent and Descriptions of Associations: Intact  Orientation:  Full (Time, Place, and Person)  Thought Content:  Logical  Suicidal Thoughts:  No  Homicidal Thoughts:  No  Memory:  Immediate;   Good Recent;   Good Remote;   Good  Judgement:  Intact  Insight:  Fair  Psychomotor Activity:  Normal  Concentration:  Good  Recall:  Good  Fund of Knowledge:Good  Language: Good  Akathisia:  Negative  Handed:  Right  AIMS (if indicated):     Assets:  Communication Skills Desire for Improvement Financial Resources/Insurance Housing Physical Health Resilience Social Support Talents/Skills Transportation Vocational/Educational  Sleep:  Number of Hours: 6.5  Cognition: WNL  ADL's:  Intact   Mental Status Per Nursing Assessment::   On Admission:  Self-harm thoughts,Self-harm behaviors  Demographic Factors:  Male and Living alone  Loss Factors: Loss of significant relationship  Historical Factors: Impulsivity  Risk Reduction Factors:   Positive social support and Positive coping skills or problem solving skills  Continued Clinical  Symptoms:  Depression:   Impulsivity  Cognitive Features That Contribute To Risk:  None    Suicide Risk:  Minimal: No identifiable suicidal ideation.  Patients presenting with no risk factors but with morbid ruminations; may be classified as minimal risk based on the severity of the depressive symptoms   Follow-up Information    [facility] Follow up.   Specialty: Behavioral Health Contact information: [address] [place] [address] [phone]       [facility] Follow up on 08/13/2020.   Why: You have an appointment for medication management services on 08/13/20 at 1;20 pm.  This appointment will be Virtual.  Contact information: [address] [place] [address] [phone]        [facility] Go to.   Specialty: Professional Counselor Why: You may go to this provider for therapy and medication management services during their walk in hours for new patients:  Monday through Friday, from 8:30 am to 12:00 pm and 1:00 pm to 2:30 pm.  Contact information: [address] [place] [address] [phone]               Plan Of Care/Follow-up recommendations:  Activity:  ad lib  Antonieta Pert, MD 07/15/2020, 1:58 PM

## 2020-07-15 NOTE — Discharge Summary (Signed)
Physician Discharge Summary Note  Patient:  Tommy [name] is an 35 y.o., male MRN:  [id number] DOB:  [birth year] Patient phone:  [phone] (home)  Patient address:   [address] [name] [id number],  Total Time spent with patient: Greater than 30 minutes  Date of Admission:  07/14/2020  Date of Discharge: 07-15-20  Reason for Admission: Passive suicidal ideations & the need to get referral for counseling [name].  Principal Problem: MDD (major depressive disorder), recurrent severe, without psychosis (HCC)  Discharge Diagnoses: Principal Problem:   MDD (major depressive disorder), recurrent severe, without psychosis (HCC)  Past Psychiatric History: Major depression  Past Medical History:  Past Medical History:  Diagnosis Date  . ALLERGIC RHINITIS 08/25/2009   Qualifier: Diagnosis of  By: Plotnikov MD, Georgina QuintAleksei V   . Anxiety   . HAND PAIN 08/25/2009   Qualifier: Diagnosis of  By: Plotnikov MD, Georgina QuintAleksei V    History reviewed. No pertinent surgical history. Family History:  Family History  Problem Relation Age of Onset  . Arthritis [name]    Family Psychiatric  History: See H&P  Social History:  Social History   Substance and Sexual Activity  Alcohol Use Yes   Comment: occ     Social History   Substance and Sexual Activity  Drug Use No    Social History   Socioeconomic History  . Marital status: Single    Spouse name: Not on file  . Number of children: Not on file  . Years of education: Not on file  . Highest education level: Not on file  Occupational History    Employer: [employer]  Tobacco Use  . Smoking status: Former Games developermoker  . Smokeless tobacco: Never Used  Substance and Sexual Activity  . Alcohol use: Yes    Comment: occ  . Drug use: No  . Sexual activity: Yes    Birth control/protection: Condom  Other Topics Concern  . Not on file  Social History Narrative  . Not on file   Social Determinants of Health   Financial Resource  Strain: Not on file  Food Insecurity: Not on file  Transportation Needs: Not on file  Physical Activity: Not on file  Stress: Not on file  Social Connections: Not on file   [name] Course: (Per Md admission evaluation notes): Patient is a 35 year old male with a past psychiatric history significant for anxiety and depression who originally presented to Tommy primary care provider on 07/08/2020 for evaluation. He had apparently presented to a local emergency department on 07/03/2020 secondary to suicidal ideation. The patient stated that he had had a recent break-up. He stated that it was very painful for him. He talked about the fact that the girlfriend had already gotten involved with another male and that was very hurtful to him. He had spoken to a friend a couple of weeks ago, who was quite concerned about Tommy demeanor. The patient is from [name], but [place] friend transported him to the [name]03[place]. He was evaluated there, and given referrals for outpatient psychotherapy. The patient [name] that after he had been discharged from the [name] he had not followed up and contacted any of the therapist. He stated that Tommy primary objective is to get into therapy and to work through these problems. As per above, he was seen by Tommy primary care provider on 07/08/2020. The patient described feeling overwhelmed over the last several weeks with vague suicidal ideation. The patient has been  on paroxetine 40 mg a day for couple years. This was initially started for premature ejaculation. He did admit that he has episodes of anxiety and a fast heart rate, and that it has helped with that. He was given referrals for psychotherapy, but the patient was really unable to move forward with that. He then presented to the [name] department on 07/13/2020 with suicidal ideation. He again [name] that he was feeling stressed, overwhelmed and was in "a  dark place". He had attempted to harm himself the Saturday prior to admission by cutting Tommy abdomen superficially. He was seen by the comprehensive clinical assessment team on 07/13/2020. The decision was made to admit him to the [name] for evaluation and stabilization. The patient denied any previous psychiatric treatment outside of the [facility]. He denied any previous psychiatric hospitalizations. He denied any previous suicide attempts. He denied any family history of any suicide attempts. No drugs or alcohol that he [name] to.  Prior to this discharge, Tommy [name] was seen & evaluated for mental health stability. The current laboratory findings were reviewed (stable), nurses notes & vital signs were reviewed as well. There are no current mental health or medical issues that should prevent this discharge at this time. Patient is being discharged to continue mental health care as noted below.   This is a brief [name] stay for Tommy [name], a 35 year old AA male with hx of major depression. He presented to the ED with complain of suicidal ideations triggered by relationship break-up. He was recommended for mood stabilization treatments. However, it was during Tommy admission evaluation that patient made it clear that he was actually seeking a referral to an outpatient psychiatric [name] for medication management & counseling [name] to help him cope with Tommy personal problems. He believed that connecting him with an outpatient psychotherapy will help him mange Tommy symptoms. He says he does not need to remain in the [name]. He asked to be discharged to start mental health treatment on an outpatient basis.  Tommy [name] currently denies any SIHI, AVH, delusional thoughts or paranoia. He denies any hx of suicide attempts. He denies any hx of completed suicide within Tommy immediate family. He denies any drugs or alcohol use. He does not appear to be responding to any internal stimuli. And because there are currently  no clinical criteria to keep Tommy [name], he will be discharged as requested.  At this time of Tommy [name] discharge, Tommy [name] is alert, attentive, well related, pleasant, mood improved & currently presents euthymic. Tommy affect is appropriate & positively reactive, no thought disorder noted, no suicidal or self injurious ideations reported, no homicidal or violent ideations present, no hallucinations, no delusions, not internally preoccupied. He is future oriented. Tommy behavior on the unit was calm & in good control. He denies any medication side effects, which we reviewed with him. He will continue further mental health care & medication management on an outpatient basis as noted below. He is provided with all the necessary information needed to make this appointment without problems. Tommy [name] was able to engage in safety planning including plan to return to [name] or contact emergency [name] if he feels unable to maintain Tommy own safety or the safety of others. Pt had no further questions, comments or concerns.  He left Tommy [name] in no apparent distress with all personal belongings. Transportation per Tommy [name].   Physical Findings: AIMS: Facial and Oral Movements Muscles of Facial Expression: None, normal Lips and Perioral Area:  None, normal Jaw: None, normal Tongue: None, normal,Extremity Movements Upper (arms, wrists, hands, fingers): None, normal Lower (legs, knees, ankles, toes): None, normal, Trunk Movements Neck, shoulders, hips: None, normal, Overall Severity Severity of abnormal movements (highest score from questions above): None, normal Incapacitation due to abnormal movements: None, normal Patient's awareness of abnormal movements (rate only patient's report): No Awareness, Dental Status Current problems with teeth and/or dentures?: No Does patient usually wear dentures?: No  CIWA:    COWS:     Musculoskeletal: Strength & Muscle Tone: within normal limits Gait &  Station: normal Patient leans: N/A  Psychiatric Specialty Exam:  Presentation  General Appearance: Casual  Eye Contact:Fair  Speech:Normal Rate  Speech Volume:Decreased  Handedness:Right   Mood and Affect  Mood:Depressed  Affect:Appropriate   Thought Process  Thought Processes:Coherent  Descriptions of Associations:Intact  Orientation:Full (Time, Place and Person)  Thought Content:Logical  History of Schizophrenia/Schizoaffective disorder:No  Duration of Psychotic Symptoms:No data recorded Hallucinations:Hallucinations: None  Ideas of Reference:None  Suicidal Thoughts:Suicidal Thoughts: Yes, Passive  Homicidal Thoughts:Homicidal Thoughts: No  Sensorium  Memory:Immediate Good; Recent Good; Remote Good  Judgment:Good  Insight:Good  Executive Functions  Concentration:Good  Attention Span:Good  Recall:Good  Fund of Knowledge:Good  Language:Good  Psychomotor Activity  Psychomotor Activity:Psychomotor Activity: Normal  Assets  Assets:Communication Skills; Desire for Improvement; Resilience; Social Support; Health and safety inspector; Housing; Talents/Skills; Transportation; Vocational/Educational  Sleep  Sleep:Sleep: Fair  Physical Exam: Physical Exam Vitals and nursing note reviewed.  HENT:     Head: Normocephalic.     Nose: Nose normal.     Mouth/Throat:     Pharynx: Oropharynx is clear.  Eyes:     Pupils: Pupils are equal, round, and reactive to light.  Cardiovascular:     Rate and Rhythm: Normal rate.  Pulmonary:     Effort: Pulmonary effort is normal.  Genitourinary:    Comments: Deferred Musculoskeletal:        General: Normal range of motion.     Cervical back: Normal range of motion.  Skin:    General: Skin is warm and dry.  Neurological:     General: No focal deficit present.     Mental Status: He is alert and oriented to person, place, and time.    Review of Systems  Constitutional: Negative.   HENT: Negative.    Eyes: Negative.   Respiratory: Negative.   Cardiovascular: Negative.   Gastrointestinal: Negative.   Genitourinary: Negative.   Musculoskeletal: Negative.   Skin: Negative.   Neurological: Negative.   Endo/Heme/Allergies:       Allergies: NKDA  Psychiatric/Behavioral: Negative for depression, hallucinations, memory loss, substance abuse and suicidal ideas. The patient is not nervous/anxious and does not have insomnia.    Blood pressure 103/63, pulse 88, temperature (!) 97.5 F (36.4 C), temperature source Oral, resp. rate 20, height 5' 10.87" (1.8 m), weight (!) 140.6 kg, SpO2 100 %. Body mass index is 43.4 kg/m.  Has this patient used any form of tobacco in the last 30 days? (Cigarettes, Smokeless Tobacco, Cigars, and/or Pipes): N/A  Blood Alcohol level:  Lab Results  Component Value Date   ETH <10 07/13/2020   ETH <11 01/28/2014   Metabolic Disorder Labs:  No results found for: HGBA1C, MPG No results found for: PROLACTIN Lab Results  Component Value Date   CHOL 161 06/23/2020   TRIG 64.0 06/23/2020   HDL 50.80 06/23/2020   CHOLHDL 3 06/23/2020   VLDL 12.8 06/23/2020   LDLCALC 97 06/23/2020   LDLCALC 93  03/01/2018   See Psychiatric Specialty Exam and Suicide Risk Assessment completed by Attending Physician prior to discharge.  Discharge destination:  Home  Is patient on multiple antipsychotic therapies at discharge:  No   Has Patient had three or more failed trials of antipsychotic monotherapy by history:  No  Recommended Plan for Multiple Antipsychotic Therapies: NA  Allergies as of 07/15/2020   No Known Allergies     Medication List    TAKE these medications     Indication  B-12 5000 MCG Subl Take 1 tablet sublingually daily  Indication: Inadequate Vitamin B12   hydrOXYzine 25 MG tablet Commonly known as: ATARAX/VISTARIL Take 1 tablet (25 mg total) by mouth 3 (three) times daily as needed for anxiety.  Indication: Feeling Anxious   multivitamin with  minerals Tabs tablet Take 1 tablet by mouth daily. Vitamin supplement  Indication: Vitamin supplement   PARoxetine 40 MG tablet Commonly known as: PAXIL Take 1 tablet (40 mg total) by mouth daily. For depression What changed: additional instructions  Indication: Major Depressive Disorder   traZODone 50 MG tablet Commonly known as: DESYREL Take 1 tablet (50 mg total) by mouth at bedtime as needed for sleep.  Indication: Trouble Sleeping   Vitamin D (Ergocalciferol) 1.25 MG (50000 UNIT) Caps capsule Commonly known as: DRISDOL Take 1 capsule (50,000 Units total) by mouth every 7 (seven) days.  Indication: Vitamin D Deficiency   Vitamin D3 50 MCG (2000 UT) capsule Take 1 capsule (2,000 Units total) by mouth daily.  Indication: Vitamin D Deficiency       Follow-up Information    [facility] Follow up.   Specialty: Behavioral Health Contact information: [address] [place] [address] [phone]       [facility] Follow up on 08/13/2020.   Why: You have an appointment for medication management [name] on 08/13/20 at 1;20 pm.  This appointment will be Virtual.  Contact information: [address] [place] [address] [phone]        [facility] Go to.   Specialty: Professional Counselor Why: You may go to this provider for therapy and medication management [name] during their walk in hours for new patients:  Monday through Friday, from 8:30 am to 12:00 pm and 1:00 pm to 2:30 pm.  Contact information: [address] [place] [address] [phone]              Follow-up recommendations: Activity:  As tolerated Diet: As recommended by your primary care doctor. Keep all scheduled follow-up appointments as recommended.   Comments: Prescriptions given at discharge.  Patient agreeable to plan.  Given opportunity to ask questions.  Appears to feel comfortable  with discharge denies any current suicidal or homicidal thought. Patient is also instructed prior to discharge to: Take all medications as prescribed by Tommy/her mental healthcare provider. Report any adverse effects and or reactions from the medicines to Tommy/her outpatient provider promptly. Patient has been instructed & cautioned: To not engage in alcohol and or illegal drug use while on prescription medicines. In the event of worsening symptoms, patient is instructed to call the crisis hotline, 911 and or go to the nearest ED for appropriate evaluation and treatment of symptoms. To follow-up with Tommy/her primary care provider for your other medical issues, concerns and or health care needs.  Signed: Armandina Stammer, NP, PMHNP, FNP-BC 07/15/2020, 1:00 PM

## 2020-07-15 NOTE — Progress Notes (Signed)
Recreation Therapy Notes  Date: 5.4.22 Time: 0930 Location: [address] Dayroom  Group Topic: Stress Management   Goal Area(s) Addresses:  Patient will actively participate in stress management techniques presented during session.  Patient will successfully identify benefit of practicing stress management post d/c.   Intervention: Guided exercise with ambient sound and script  Activity :Guided Imagery  LRT provided education, instruction, and demonstration on practice of visualization via guided imagery. Patient was asked to participate in the technique introduced during session.   Education:  Stress Management, Discharge Planning.   Education Outcome: Acknowledges education  Clinical Observations/Feedback: Patient did not attend group session.    Caroll Rancher, LRT/CTRS      Lillia Abed, Jimeka Balan A 07/15/2020 11:30 AM

## 2020-07-15 NOTE — BHH Counselor (Signed)
Adult Comprehensive Assessment  Patient ID: CLELAND SIMKINS, male   DOB: 03/19/1985, 35 y.o.   MRN: [id number]  Information Source: Information source: Patient  Current Stressors:  Patient states their primary concerns and needs for treatment are:: "I am overwhelmed because of a break-up" Patient states their goals for this hospitilization and ongoing recovery are:: "To get into therapy" Educational / Learning stressors: Pt reports having 2 years of college Employment / Job issues: Pt reports working for [employer] Relationships: Pt reports some conflict with his father from childhood Surveyor, quantity / Lack of resources (include bankruptcy): Pt reports no stressors Housing / Lack of housing: Pt reports living alone Physical health (include injuries & life threatening diseases): Pt reports no stressors Social relationships: Pt reports no stressors Substance abuse: Pt denies all substance use Bereavement / Loss: Pt reports a recent break-up with a girlfriend of 4 years  Living/Environment/Situation:  Living Arrangements: Alone Living conditions (as described by patient or guardian): "It is a nice place and I like it" Who else lives in the home?: No one How long has patient lived in current situation?: 3 years What is atmosphere in current home: Comfortable  Family History:  Marital status: Single Are you sexually active?: Yes What is your sexual orientation?: Heterosexual Has your sexual activity been affected by drugs, alcohol, medication, or emotional stress?: No Does patient have children?: No (Pt reports girlfriend said she was pregnant and miscarried after their break-up)  Childhood History:  By whom was/is the patient raised?: Mother Additional childhood history information: Pt reports father was there but was not present in his life Description of patient's relationship with caregiver when they were a child: "It was good with my mother but not as good wtih my father" Patient's  description of current relationship with people who raised him/her: "Things are great with my mom but I am indifferent with my father" How were you disciplined when you got in trouble as a child/adolescent?: Groundings Does patient have siblings?: Yes Number of Siblings: 2 Description of patient's current relationship with siblings: "I have 2 brothers but I am only close with my oldest brother" Did patient suffer any verbal/emotional/physical/sexual abuse as a child?: No Did patient suffer from severe childhood neglect?: No Has patient ever been sexually abused/assaulted/raped as an adolescent or adult?: No Was the patient ever a victim of a crime or a disaster?: No Witnessed domestic violence?: No Has patient been affected by domestic violence as an adult?: No  Education:  Highest grade of school patient has completed: 12th grade, 2 years of college Currently a student?: No Learning disability?: No  Employment/Work Situation:   Employment situation: Employed Where is patient currently employed?: [employer] How long has patient been employed?: 1 year Patient's job has been impacted by current illness: No What is the longest time patient has a held a job?: 10 years Where was the patient employed at that time?: [employer] Has patient ever been in the [employer]?: No  Financial Resources:   Financial resources: Income from [employer] Does patient have a representative payee or guardian?: No  Alcohol/Substance Abuse:   What has been your use of drugs/alcohol within the last 12 months?: Pt denies all substance use If attempted suicide, did drugs/alcohol play a role in this?: No Alcohol/Substance Abuse Treatment Hx: Denies past history Has alcohol/substance abuse ever caused legal problems?: No  Social Support System:   Patient's Community Support System: Good Describe Community Support System: Mother, friends, brother Type of faith/religion: Ephriam Knuckles How  does  patient's faith help to cope with current illness?: Prayer  Leisure/Recreation:   Do You Have Hobbies?: Yes Leisure and Hobbies: Focusing on my career and Designer, fashion/clothing  Strengths/Needs:   What is the patient's perception of their strengths?: Computers, communication, and getting along with others Patient states they can use these personal strengths during their treatment to contribute to their recovery: "It gives me something else to focus on" Patient states these barriers may affect/interfere with their treatment: None Patient states these barriers may affect their return to the community: None Other important information patient would like considered in planning for their treatment: None  Discharge Plan:   Currently receiving community mental health services: No Patient states concerns and preferences for aftercare planning are: Therapy that is virtual that can become in-person and in [facility] or [place] Patient states they will know when they are safe and ready for discharge when: "When I have my aftercare set up" Does patient have access to transportation?: Yes (Pt reports him and mother share a car) Does patient have financial barriers related to discharge medications?: No Will patient be returning to same living situation after discharge?: Yes  Summary/Recommendations:   Summary and Recommendations (to be completed by the evaluator): Erasmo Vertz is a 35 year old, AA, male who was admitted to the hospital due to suicidal thoughts and worsening depression that he states was brought on by a recent break-up with a girlfriend of 4 years.  The Pt reports that this girlfriend also told him that she was pregnant with his child and miscarried but the Pt states that he is not sure if this information is true or not.  The Pt reports that he lives alone in a townhome and works for [employer].  The Pt shares a car with his mother and states that he has a very close relationship with his  mother and oldest brother.  The Pt reports some conflict with his father due to his father being in the home but was not emotionally present in the Pt's life.  The Pt denies all substance use and any history of substance use treatment.  While in the hospital the Pt can benefit from crisis stabilization, medication evaluation, group therapy, psycho-education, case management, and discharge planning.  Upon discharge the Pt will return to his townhome and will follow up with [facility] [place] for therapy and with [facility] for medication management.  Aram Beecham. 07/15/2020

## 2020-07-15 NOTE — BHH Group Notes (Signed)
Patient did not attend morning goals group.  

## 2020-07-15 NOTE — Tx Team (Signed)
Interdisciplinary Treatment and Diagnostic Plan Update  07/15/2020 Time of Session: 9:25am  Tommy [name] MRN: [id number]  Principal Diagnosis: <principal problem not specified>  Secondary Diagnoses: Active Problems:   MDD (major depressive disorder), recurrent severe, without psychosis (HCC)   Current Medications:  Current Facility-Administered Medications  Medication Dose Route Frequency Provider Last Rate Last Admin  . acetaminophen (TYLENOL) tablet 650 mg  650 mg Oral Q6H PRN Antonieta Pert, MD      . alum & mag hydroxide-simeth (MAALOX/MYLANTA) 200-200-20 MG/5ML suspension 30 mL  30 mL Oral Q4H PRN Antonieta Pert, MD      . cholecalciferol (VITAMIN D3) tablet 2,000 Units  2,000 Units Oral Daily Antonieta Pert, MD   2,000 Units at 07/15/20 4696  . hydrOXYzine (ATARAX/VISTARIL) tablet 25 mg  25 mg Oral TID PRN Antonieta Pert, MD      . magnesium hydroxide (MILK OF MAGNESIA) suspension 30 mL  30 mL Oral Daily PRN Antonieta Pert, MD      . PARoxetine (PAXIL) tablet 40 mg  40 mg Oral Daily Antonieta Pert, MD   40 mg at 07/15/20 2952  . traZODone (DESYREL) tablet 50 mg  50 mg Oral QHS PRN Antonieta Pert, MD       PTA Medications: Medications Prior to Admission  Medication Sig Dispense Refill Last Dose  . Cholecalciferol (VITAMIN D3) 50 MCG (2000 UT) capsule Take 1 capsule (2,000 Units total) by mouth daily. 100 capsule 3   . Cyanocobalamin (B-12) 5000 MCG SUBL Take 1 tablet sublingually daily 30 tablet 1   . PARoxetine (PAXIL) 40 MG tablet TAKE 1 TABLET BY MOUTH  DAILY (Patient taking differently: Take 40 mg by mouth daily.) 90 tablet 1   . Vitamin D, Ergocalciferol, (DRISDOL) 1.25 MG (50000 UNIT) CAPS capsule Take 1 capsule (50,000 Units total) by mouth every 7 (seven) days. 6 capsule 0     Patient Stressors: Educational concerns Loss of significant relationship (Recent breakup from girlfriend >4 yrs) Medication change or noncompliance  Patient  Strengths: Ability for insight Capable of independent living Licensed conveyancer Motivation for treatment/growth Physical Health Religious Affiliation Supportive family/friends Work skills  Treatment Modalities: Medication Management, Group therapy, Case management,  1 to 1 session with clinician, Psychoeducation, Recreational therapy.   Physician Treatment Plan for Primary Diagnosis: <principal problem not specified> Long Term Goal(s): Improvement in symptoms so as ready for discharge Improvement in symptoms so as ready for discharge   Short Term Goals: Ability to identify changes in lifestyle to reduce recurrence of condition will improve Ability to verbalize feelings will improve Ability to disclose and discuss suicidal ideas Ability to demonstrate self-control will improve Ability to identify and develop effective coping behaviors will improve Ability to maintain clinical measurements within normal limits will improve Ability to identify changes in lifestyle to reduce recurrence of condition will improve Ability to verbalize feelings will improve Ability to disclose and discuss suicidal ideas Ability to demonstrate self-control will improve Ability to identify and develop effective coping behaviors will improve Ability to maintain clinical measurements within normal limits will improve  Medication Management: Evaluate patient's response, side effects, and tolerance of medication regimen.  Therapeutic Interventions: 1 to 1 sessions, Unit Group sessions and Medication administration.  Evaluation of Outcomes: Adequate for Discharge  Physician Treatment Plan for Secondary Diagnosis: Active Problems:   MDD (major depressive disorder), recurrent severe, without psychosis (HCC)  Long Term Goal(s): Improvement in symptoms so as ready for discharge Improvement  in symptoms so as ready for discharge   Short Term Goals: Ability to identify changes in lifestyle to  reduce recurrence of condition will improve Ability to verbalize feelings will improve Ability to disclose and discuss suicidal ideas Ability to demonstrate self-control will improve Ability to identify and develop effective coping behaviors will improve Ability to maintain clinical measurements within normal limits will improve Ability to identify changes in lifestyle to reduce recurrence of condition will improve Ability to verbalize feelings will improve Ability to disclose and discuss suicidal ideas Ability to demonstrate self-control will improve Ability to identify and develop effective coping behaviors will improve Ability to maintain clinical measurements within normal limits will improve     Medication Management: Evaluate patient's response, side effects, and tolerance of medication regimen.  Therapeutic Interventions: 1 to 1 sessions, Unit Group sessions and Medication administration.  Evaluation of Outcomes: Adequate for Discharge   RN Treatment Plan for Primary Diagnosis: <principal problem not specified> Long Term Goal(s): Knowledge of disease and therapeutic regimen to maintain health will improve  Short Term Goals: Ability to remain free from injury will improve, Ability to participate in decision making will improve, Ability to verbalize feelings will improve, Ability to disclose and discuss suicidal ideas and Ability to identify and develop effective coping behaviors will improve  Medication Management: RN will administer medications as ordered by provider, will assess and evaluate patient's response and provide education to patient for prescribed medication. RN will report any adverse and/or side effects to prescribing provider.  Therapeutic Interventions: 1 on 1 counseling sessions, Psychoeducation, Medication administration, Evaluate responses to treatment, Monitor vital signs and CBGs as ordered, Perform/monitor CIWA, COWS, AIMS and Fall Risk screenings as ordered,  Perform wound care treatments as ordered.  Evaluation of Outcomes: Adequate for Discharge   LCSW Treatment Plan for Primary Diagnosis: <principal problem not specified> Long Term Goal(s): Safe transition to appropriate next level of care at discharge, Engage patient in therapeutic group addressing interpersonal concerns.  Short Term Goals: Engage patient in aftercare planning with referrals and resources, Increase emotional regulation, Facilitate acceptance of mental health diagnosis and concerns, Identify triggers associated with mental health/substance abuse issues and Increase skills for wellness and recovery  Therapeutic Interventions: Assess for all discharge needs, 1 to 1 time with Social worker, Explore available resources and support systems, Assess for adequacy in community support network, Educate family and significant other(s) on suicide prevention, Complete Psychosocial Assessment, Interpersonal group therapy.  Evaluation of Outcomes: Adequate for Discharge   Progress in Treatment: Attending groups: Yes. Participating in groups: Yes. Taking medication as prescribed: Yes. Toleration medication: Yes. Family/Significant other contact made: Yes, individual(s) contacted:  Mother Patient understands diagnosis: Yes. Discussing patient identified problems/goals with staff: Yes. Medical problems stabilized or resolved: Yes. Denies suicidal/homicidal ideation: Yes. Issues/concerns per patient self-inventory: No.   New problem(s) identified: No, Describe:  None  New Short Term/Long Term Goal(s): medication stabilization, elimination of SI thoughts, development of comprehensive mental wellness plan.   Patient Goals: "To get into some therapy"  Discharge Plan or Barriers: Patient recently admitted. CSW will continue to follow and assess for appropriate referrals and possible discharge planning.   Reason for Continuation of Hospitalization: Medication stabilization  Estimated  Length of Stay: 3 to 5 days   Attendees: Patient: Tommy [name]  07/15/2020   Physician: Landry Mellow, MD 07/15/2020   Nursing:  07/15/2020   RN Care Manager: 07/15/2020   Social Worker: Melba Coon, LCSWA 07/15/2020   Recreational Therapist:  07/15/2020  Other:  07/15/2020   Other:  07/15/2020   Other: 07/15/2020     Scribe for Treatment Team: Aram Beecham, LCSWA 07/15/2020 11:19 AM

## 2020-07-15 NOTE — Progress Notes (Signed)
  [facility] Adult Case Management Discharge Plan :  Will you be returning to the same living situation after discharge:  Yes,  Home At discharge, do you have transportation home?: Yes,  Mother  Do you have the ability to pay for your medications: Yes,  Insurance   Release of information consent forms completed and in the chart;  Patient's signature needed at discharge.  Patient to Follow up at:  Follow-up Information    [facility] Follow up.   Specialty: Behavioral Health Contact information: [address] [address] [phone]       [facility] Follow up on 08/13/2020.   Why: You have an appointment for medication management services on 08/13/20 at 1;20 pm.  This appointment will be Virtual.  Contact information: [address] [place] [address] [phone]        [facility] Go to.   Specialty: Professional Counselor Why: You may go to this provider for therapy and medication management services during their walk in hours for new patients:  Monday through Friday, from 8:30 am to 12:00 pm and 1:00 pm to 2:30 pm.  Contact information: [address] [place] [address] [phone]               Next level of care provider has access to [facility] Link:no  Safety Planning and Suicide Prevention discussed: Yes,  with patient and mother      Has patient been referred to the Quitline?: Patient refused referral  Patient has been referred for addiction treatment: N/A  Aram Beecham, LCSWA 07/15/2020, 11:45 AM

## 2020-07-23 ENCOUNTER — Other Ambulatory Visit: Payer: Self-pay | Admitting: *Deleted

## 2020-07-23 DIAGNOSIS — F418 Other specified anxiety disorders: Secondary | ICD-10-CM

## 2020-07-23 MED ORDER — PAROXETINE HCL 40 MG PO TABS: 40.0000 mg | ORAL_TABLET | Freq: Every day | ORAL | 3 refills | Status: DC

## 2020-08-05 ENCOUNTER — Ambulatory Visit: Payer: 59 | Admitting: Internal Medicine

## 2020-08-07 ENCOUNTER — Ambulatory Visit: Payer: 59 | Admitting: Internal Medicine

## 2021-08-07 ENCOUNTER — Other Ambulatory Visit: Payer: Self-pay | Admitting: Internal Medicine

## 2021-08-07 DIAGNOSIS — F418 Other specified anxiety disorders: Secondary | ICD-10-CM

## 2021-10-03 ENCOUNTER — Other Ambulatory Visit: Payer: Self-pay | Admitting: Internal Medicine

## 2021-10-03 DIAGNOSIS — F418 Other specified anxiety disorders: Secondary | ICD-10-CM

## 2022-02-25 ENCOUNTER — Encounter (HOSPITAL_COMMUNITY): Payer: Self-pay | Admitting: *Deleted

## 2022-02-25 ENCOUNTER — Ambulatory Visit (HOSPITAL_COMMUNITY)
Admission: EM | Admit: 2022-02-25 | Discharge: 2022-02-25 | Disposition: A | Discharge status: Home / Self Care | Payer: 59 | Attending: Emergency Medicine | Admitting: Emergency Medicine

## 2022-02-25 DIAGNOSIS — B349 Viral infection, unspecified: Secondary | ICD-10-CM | POA: Insufficient documentation

## 2022-02-25 DIAGNOSIS — Z1152 Encounter for screening for COVID-19: Secondary | ICD-10-CM | POA: Insufficient documentation

## 2022-02-25 LAB — RESP PANEL BY RT-PCR (FLU A&B, COVID) ARPGX2
Influenza A by PCR: NEGATIVE
Influenza B by PCR: NEGATIVE
SARS Coronavirus 2 by RT PCR: NEGATIVE

## 2022-02-25 MED ORDER — ACETAMINOPHEN 325 MG PO TABS
650.0000 mg | ORAL_TABLET | Freq: Once | ORAL | Status: AC
  Administered 2022-02-25: 650 mg via ORAL

## 2022-02-25 MED ORDER — IBUPROFEN 800 MG PO TABS
800.0000 mg | ORAL_TABLET | Freq: Once | ORAL | Status: AC
  Administered 2022-02-25: 800 mg via ORAL

## 2022-02-25 MED ORDER — IBUPROFEN 800 MG PO TABS
ORAL_TABLET | ORAL | Status: AC
  Filled 2022-02-25: qty 1

## 2022-02-25 MED ORDER — ACETAMINOPHEN 325 MG PO TABS
ORAL_TABLET | ORAL | Status: AC
  Filled 2022-02-25: qty 2

## 2022-02-25 MED ORDER — BENZONATATE 100 MG PO CAPS: 100.0000 mg | ORAL_CAPSULE | Freq: Three times a day (TID) | ORAL | 0 refills | Status: DC

## 2022-02-25 NOTE — Discharge Instructions (Signed)
We will call you if any of your test results warrant a change in your plan of care, you may view these test results on MyChart.   Tessalon has been sent to the pharmacy for cough, you can use this medication every 8 hours as needed.   Viral illnesses usually takes 7 to 10 days to resolve.  Using over-the-counter medications can help treat the symptoms.  You may rotate Tylenol and ibuprofen every 4-6 hours.  For example, take Tylenol now and then 4 to 6 hours later take ibuprofen.  You can use Tylenol for fever and moderate pain, you can take this medication every 4-6 hours, please do not take more than 3000 mg in a 24-hour day.  You can take ibuprofen every 6 hours, do not take more than 2400 mg in a 24-hour day.  I advised that you do not take ibuprofen on an empty stomach, ibuprofen can cause GI problems such as GI bleeding.  Maintaining hydration status is very important, please drink at least 8 cups of water daily.  Please try to intake nutrient dense meals.  Please make sure to pick up some Pedialyte when you are at the pharmacy.

## 2022-02-25 NOTE — ED Triage Notes (Signed)
Pt states that last night he started with a dry cough, fever 100.7 he took tylenol (3:30am) and fever reduced. At home COVID test X 2 Neg. He has been taking mucinex.   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

## 2022-02-25 NOTE — ED Provider Notes (Signed)
[place]    CSN: [id number] Arrival date & time: 02/25/22  1224      History   Chief Complaint No chief complaint on file.   HPI Tommy [name] is a 36 y.o. male. Patient presents c/o nonproductive cough and fever that started yesterday.  Patient reports having a temperature of 100.7 at home.  Patient reports that he is taken 2 COVID test at home with negative results.  Patient denies any recent exposure to a viral illness that he is aware of.  Patient states that approximately 1 month ago his mom did have COVID but her symptoms have now resolved.  Patient is requesting retesting for COVID because he is concerned about getting his mother sick since she is a cancer patient.  Patient has taken Mucinex with some relief of symptoms of his cough.  Patient denies any chronic health problems.   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 No pertinent surgical history.     Home Medications    Prior to Admission medications   Medication Sig Start Date End Date Taking? Authorizing Provider  benzonatate (TESSALON) 100 MG capsule Take 1 capsule (100 mg total) by mouth every 8 (eight) hours. 02/25/22   Yes Flossie Dibble, NP  PARoxetine (PAXIL) 40 MG tablet Take 1 by mouth daily for Depression. Annual appt is due must see provider for future refills 08/10/21  Yes Plotnikov, Evie Lacks, MD  Cholecalciferol (VITAMIN D3) 50 MCG (2000 UT) capsule Take 1 capsule (2,000 Units total) by mouth daily. 06/23/20   Plotnikov, Evie Lacks, MD  Cyanocobalamin (B-12) 5000 MCG SUBL Take 1 tablet sublingually daily 07/08/20   Plotnikov, Evie Lacks, MD  hydrOXYzine (ATARAX/VISTARIL) 25 MG tablet Take 1 tablet (25 mg total) by mouth 3 (three) times daily as needed for anxiety. 07/15/20   Lindell Spar I, NP  Multiple Vitamin (MULTIVITAMIN WITH MINERALS) TABS tablet Take 1 tablet by mouth daily. Vitamin supplement 07/15/20   Lindell Spar I, NP  traZODone (DESYREL) 50 MG tablet Take 1 tablet (50 mg total) by mouth at bedtime as needed for sleep. 07/15/20   Lindell Spar I, NP  Vitamin D, Ergocalciferol, (DRISDOL) 1.25 MG (50000 UNIT) CAPS capsule Take 1 capsule (50,000 Units total) by mouth every 7 (seven) days. 06/23/20   Plotnikov, Evie Lacks, MD    Family History Family History  Problem Relation Age of Onset   Arthritis Mother    Multiple myeloma Mother     Social History Social History   Tobacco Use   Smoking status: Former   Smokeless tobacco: Never  Substance Use Topics   Alcohol use: Yes  Comment: occ   Drug use: No     Allergies   Patient has no known allergies.   Review of Systems Review of Systems  Constitutional:  Positive for chills, fatigue and fever. Negative for activity change, appetite change and diaphoresis.  HENT:  Positive for rhinorrhea. Negative for congestion, ear discharge, ear pain, postnasal drip, sinus pressure, sinus pain, sneezing, sore throat, trouble swallowing and voice change.   Eyes: Negative.   Respiratory:  Positive for cough. Negative for choking, chest tightness, shortness of breath, wheezing and stridor.   Cardiovascular:  Negative for chest pain and palpitations.   Gastrointestinal:  Negative for abdominal pain, nausea and vomiting.  Musculoskeletal:  Negative for myalgias.     Physical Exam Triage Vital Signs ED Triage Vitals  Enc Vitals Group     BP 02/25/22 1328 134/89     Pulse Rate 02/25/22 1328 (!) 107     Resp 02/25/22 1328 18     Temp 02/25/22 1328 (!) 100.9 F (38.3 C)     Temp Source 02/25/22 1328 Oral     SpO2 02/25/22 1328 99 %     Weight --      Height --      Head Circumference --      Peak Flow --      Pain Score 02/25/22 1326 0     Pain Loc --      Pain Edu? --      Excl. in [place]? --    No data found.  Updated Vital Signs BP 134/89 (BP Location: Right Arm)   Pulse (!) 107   Temp 100.3 F (37.9 C) (Oral)   Resp 18   SpO2 99%    Physical Exam Vitals and nursing note reviewed.  Constitutional:      Appearance: Normal appearance.  HENT:     Right Ear: Hearing, tympanic membrane, ear canal and external ear normal.     Left Ear: Hearing, tympanic membrane, ear canal and external ear normal.     Nose: Rhinorrhea present. No congestion. Rhinorrhea is clear.     Right Turbinates: Not enlarged, swollen or pale.     Left Turbinates: Not enlarged, swollen or pale.     Right Sinus: No maxillary sinus tenderness or frontal sinus tenderness.     Left Sinus: No maxillary sinus tenderness or frontal sinus tenderness.     Mouth/Throat:     Mouth: Mucous membranes are moist.     Pharynx: Oropharynx is clear. Uvula midline. No pharyngeal swelling, oropharyngeal exudate, posterior oropharyngeal erythema or uvula swelling.     Tonsils: No tonsillar exudate or tonsillar abscesses. 0 on the right. 0 on the left.  Cardiovascular:     Rate and Rhythm: Normal rate and regular rhythm.     Heart sounds: Normal heart sounds, S1 normal and S2 normal.  Pulmonary:     Effort: Pulmonary effort is normal.     Breath sounds: Normal breath sounds and air entry. No decreased breath sounds, wheezing, rhonchi or rales.  Lymphadenopathy:      Cervical: No cervical adenopathy.  Neurological:     Mental Status: He is alert.      UC Treatments / Results  Labs (all labs ordered are listed, but only abnormal results are displayed) Labs Reviewed  RESP PANEL BY RT-PCR (FLU A&B, COVID) [id number]    EKG   Radiology No results found.  Procedures Procedures (including critical care time)  Medications Ordered in UC Medications  acetaminophen (TYLENOL)  tablet 650 mg (650 mg Oral Given 02/25/22 1334)  ibuprofen (ADVIL) tablet 800 mg (800 mg Oral Given 02/25/22 1414)    Initial Impression / Assessment and Plan / UC Course  I have reviewed the triage vital signs and the nursing notes.  Pertinent labs & imaging results that were available during my care of the patient were reviewed by me and considered in my medical decision making (see chart for details).     Patient was evaluated for viral illness.  COVID and flu is pending.  Patient would be a candidate for Tamiflu if flu is positive.  Patient was made aware of symptom management of a viral illness.  Tessalon prescription was sent to the pharmacy for symptom management.Patient made aware of timeline for symptom resolution and when follow-up would be necessary.  Patient made aware of results reporting protocol and MyChart.  Patient verbalized understanding of instructions.    Charting was provided using a a verbal dictation system, charting was proofread for errors, errors may occur which could change the meaning of the information charted.   Final Clinical Impressions(s) / UC Diagnoses   Final diagnoses:  Viral illness     Discharge Instructions      We will call you if any of your test results warrant a change in your plan of care, you may view these test results on MyChart.   Tessalon has been sent to the pharmacy for cough, you can use this medication every 8 hours as needed.   Viral illnesses usually takes 7 to 10 days to resolve.  Using over-the-counter  medications can help treat the symptoms.  You may rotate Tylenol and ibuprofen every 4-6 hours.  For example, take Tylenol now and then 4 to 6 hours later take ibuprofen.  You can use Tylenol for fever and moderate pain, you can take this medication every 4-6 hours, please do not take more than 3000 mg in a 24-hour day.  You can take ibuprofen every 6 hours, do not take more than 2400 mg in a 24-hour day.  I advised that you do not take ibuprofen on an empty stomach, ibuprofen can cause GI problems such as GI bleeding.  Maintaining hydration status is very important, please drink at least 8 cups of water daily.  Please try to intake nutrient dense meals.  Please make sure to pick up some Pedialyte when you are at the pharmacy.      ED Prescriptions     Medication Sig Dispense Auth. Provider   benzonatate (TESSALON) 100 MG capsule Take 1 capsule (100 mg total) by mouth every 8 (eight) hours. 28 capsule Flossie Dibble, NP      PDMP not reviewed this encounter.   Flossie Dibble, NP 02/25/22 1644

## 2022-03-04 ENCOUNTER — Ambulatory Visit (INDEPENDENT_AMBULATORY_CARE_PROVIDER_SITE_OTHER): Payer: 59

## 2022-03-04 ENCOUNTER — Encounter (HOSPITAL_COMMUNITY): Payer: Self-pay | Admitting: Emergency Medicine

## 2022-03-04 ENCOUNTER — Ambulatory Visit (HOSPITAL_COMMUNITY)
Admission: EM | Admit: 2022-03-04 | Discharge: 2022-03-04 | Disposition: A | Discharge status: Home / Self Care | Payer: 59 | Attending: Family Medicine | Admitting: Family Medicine

## 2022-03-04 DIAGNOSIS — R062 Wheezing: Secondary | ICD-10-CM

## 2022-03-04 DIAGNOSIS — J4521 Mild intermittent asthma with (acute) exacerbation: Secondary | ICD-10-CM | POA: Diagnosis not present

## 2022-03-04 DIAGNOSIS — R059 Cough, unspecified: Secondary | ICD-10-CM | POA: Diagnosis not present

## 2022-03-04 MED ORDER — ALBUTEROL SULFATE HFA 108 (90 BASE) MCG/ACT IN AERS: 2.0000 | INHALATION_SPRAY | RESPIRATORY_TRACT | 0 refills | Status: AC | PRN

## 2022-03-04 MED ORDER — PREDNISONE 20 MG PO TABS: 40.0000 mg | ORAL_TABLET | Freq: Every day | ORAL | 0 refills | Status: AC

## 2022-03-04 NOTE — ED Provider Notes (Signed)
[place]    CSN: [id number] Arrival date & time: 03/04/22  4854      History   Chief Complaint Chief Complaint  Patient presents with   Cough    HPI Tommy [name] is a 36 y.o. male.    Cough  Here for continued cough and a feeling of possible wheezing or grumbling in his lower chest.  He was seen here December 15, for fever and congestion.  Testing was negative for flu and for COVID at that time.  He was prescribed some Tessalon Perles and he took Tylenol and Advil and Mucinex.  The fever resolved a couple days later.  He overall is improved, but does continue to cough, sometimes with some mucus production.  He also states he feels some sensation like growling in his lower chest when he breathes out.  He has never been known to have asthma and has never used an inhaler in the past.  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 No pertinent surgical history.     Home Medications    Prior to Admission medications   Medication Sig Start Date End Date Taking? Authorizing Provider  albuterol (VENTOLIN HFA) 108 (90 Base) MCG/ACT inhaler Inhale 2 puffs into the lungs every 4 (four) hours as  needed for wheezing or shortness of breath. 03/04/22  Yes Barrett Henle, MD  predniSONE (DELTASONE) 20 MG tablet Take 2 tablets (40 mg total) by mouth daily with breakfast for 5 days. 03/04/22 03/09/22 Yes Barrett Henle, MD  Cholecalciferol (VITAMIN D3) 50 MCG (2000 UT) capsule Take 1 capsule (2,000 Units total) by mouth daily. 06/23/20   Plotnikov, Evie Lacks, MD  Cyanocobalamin (B-12) 5000 MCG SUBL Take 1 tablet sublingually daily 07/08/20   Plotnikov, Evie Lacks, MD  hydrOXYzine (ATARAX/VISTARIL) 25 MG tablet Take 1 tablet (25 mg total) by mouth 3 (three) times daily as needed for anxiety. 07/15/20   Lindell Spar I, NP  Multiple Vitamin (MULTIVITAMIN WITH MINERALS) TABS tablet Take 1 tablet by mouth daily. Vitamin supplement 07/15/20   Lindell Spar I, NP  PARoxetine (PAXIL) 40 MG tablet Take 1 by mouth daily for Depression. Annual appt is due must see provider for future refills 08/10/21   Plotnikov, Evie Lacks, MD  traZODone (DESYREL) 50 MG tablet Take 1 tablet (50 mg total) by mouth at bedtime as needed for sleep. 07/15/20   Lindell Spar I, NP  Vitamin D, Ergocalciferol, (DRISDOL) 1.25 MG (50000 UNIT) CAPS capsule Take 1 capsule (50,000 Units total) by mouth every 7 (seven) days. 06/23/20   Plotnikov, Evie Lacks, MD    Family History Family History  Problem Relation Age  of Onset   Arthritis Mother    Multiple myeloma Mother     Social History Social History   Tobacco Use   Smoking status: Former   Smokeless tobacco: Never  Substance Use Topics   Alcohol use: Yes    Comment: occ   Drug use: No     Allergies   Patient has no known allergies.   Review of Systems Review of Systems  Respiratory:  Positive for cough.      Physical Exam Triage Vital Signs ED Triage Vitals  Enc Vitals Group     BP 03/04/22 0835 123/88     Pulse Rate 03/04/22 0835 83     Resp 03/04/22 0835 16     Temp 03/04/22 0835 98.1 F (36.7 C)     Temp Source 03/04/22 0835 Oral     SpO2 03/04/22 0835  97 %     Weight --      Height --      Head Circumference --      Peak Flow --      Pain Score 03/04/22 0836 0     Pain Loc --      Pain Edu? --      Excl. in [place]? --    No data found.  Updated Vital Signs BP 123/88 (BP Location: Right Arm)   Pulse 83   Temp 98.1 F (36.7 C) (Oral)   Resp 16   SpO2 97%   Visual Acuity Right Eye Distance:   Left Eye Distance:   Bilateral Distance:    Right Eye Near:   Left Eye Near:    Bilateral Near:     Physical Exam Vitals reviewed.  Constitutional:      General: He is not in acute distress.    Appearance: He is not ill-appearing, toxic-appearing or diaphoretic.  HENT:     Right Ear: Tympanic membrane and ear canal normal.     Left Ear: Tympanic membrane and ear canal normal.     Nose: Nose normal.     Mouth/Throat:     Mouth: Mucous membranes are moist.     Pharynx: No oropharyngeal exudate or posterior oropharyngeal erythema.  Eyes:     Extraocular Movements: Extraocular movements intact.     Conjunctiva/sclera: Conjunctivae normal.     Pupils: Pupils are equal, round, and reactive to light.  Cardiovascular:     Rate and Rhythm: Normal rate and regular rhythm.     Heart sounds: No murmur heard. Pulmonary:     Effort: No respiratory distress.     Breath sounds: No stridor. No rhonchi or rales.     Comments: He has expiratory wheezes heard in the posterior lung fields bilaterally. Musculoskeletal:     Cervical back: Neck supple.  Lymphadenopathy:     Cervical: No cervical adenopathy.  Skin:    Capillary Refill: Capillary refill takes less than 2 seconds.     Coloration: Skin is not jaundiced or pale.  Neurological:     General: No focal deficit present.     Mental Status: He is alert and oriented to person, place, and time.  Psychiatric:        Behavior: Behavior normal.      UC Treatments / Results  Labs (all labs ordered are listed, but only abnormal results are displayed) Labs Reviewed - No data to  display  EKG   Radiology DG Chest 2 View  Result Date: 03/04/2022 CLINICAL DATA:  Cough and wheezing. EXAM: CHEST -  2 VIEW COMPARISON:  None Available. FINDINGS: The heart size and mediastinal contours are within normal limits. Both lungs are clear. The visualized skeletal structures are unremarkable. IMPRESSION: No active cardiopulmonary disease. Electronically Signed   By: Keane Police D.O.   On: 03/04/2022 09:39    Procedures Procedures (including critical care time)  Medications Ordered in UC Medications - No data to display  Initial Impression / Assessment and Plan / UC Course  I have reviewed the triage vital signs and the nursing notes.  Pertinent labs & imaging results that were available during my care of the patient were reviewed by me and considered in my medical decision making (see chart for details).        Chest x-ray is clear without infiltrate.  I am going to treat for possible asthma exacerbation Final Clinical Impressions(s) / UC Diagnoses   Final diagnoses:  Mild intermittent asthma with exacerbation     Discharge Instructions       X-ray was clear Albuterol inhaler--do 2 puffs every 4 hours as needed for shortness of breath or wheezing  Take prednisone 20 mg--2 daily for 5 days      ED Prescriptions     Medication Sig Dispense Auth. Provider   albuterol (VENTOLIN HFA) 108 (90 Base) MCG/ACT inhaler Inhale 2 puffs into the lungs every 4 (four) hours as needed for wheezing or shortness of breath. 1 each Barrett Henle, MD   predniSONE (DELTASONE) 20 MG tablet Take 2 tablets (40 mg total) by mouth daily with breakfast for 5 days. 10 tablet Windy Carina Gwenlyn Perking, MD      PDMP not reviewed this encounter.   Barrett Henle, MD 03/04/22 [phone]

## 2022-03-04 NOTE — ED Triage Notes (Signed)
Fever and cough last week. Cough still present with occasional mucus production. Denies pain, N/V/D. 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 Was seen for this on 12/15, did take the medications as prescribed, believes the medications helped.

## 2022-03-04 NOTE — Discharge Instructions (Signed)
  X-ray was clear Albuterol inhaler--do 2 puffs every 4 hours as needed for shortness of breath or wheezing  Take prednisone 20 mg--2 daily for 5 days

## 2022-04-06 ENCOUNTER — Encounter: Payer: Self-pay | Admitting: Internal Medicine

## 2022-04-06 ENCOUNTER — Ambulatory Visit (INDEPENDENT_AMBULATORY_CARE_PROVIDER_SITE_OTHER): Payer: Managed Care, Other (non HMO) | Admitting: Internal Medicine

## 2022-04-06 VITALS — BP 130/90 | HR 94 | Temp 98.8°F | Ht 70.0 in | Wt 331.4 lb

## 2022-04-06 DIAGNOSIS — F418 Other specified anxiety disorders: Secondary | ICD-10-CM

## 2022-04-06 DIAGNOSIS — J45909 Unspecified asthma, uncomplicated: Secondary | ICD-10-CM | POA: Insufficient documentation

## 2022-04-06 DIAGNOSIS — Z125 Encounter for screening for malignant neoplasm of prostate: Secondary | ICD-10-CM | POA: Diagnosis not present

## 2022-04-06 DIAGNOSIS — Z0000 Encounter for general adult medical examination without abnormal findings: Secondary | ICD-10-CM

## 2022-04-06 DIAGNOSIS — E559 Vitamin D deficiency, unspecified: Secondary | ICD-10-CM | POA: Diagnosis not present

## 2022-04-06 DIAGNOSIS — J4520 Mild intermittent asthma, uncomplicated: Secondary | ICD-10-CM

## 2022-04-06 DIAGNOSIS — R739 Hyperglycemia, unspecified: Secondary | ICD-10-CM | POA: Diagnosis not present

## 2022-04-06 DIAGNOSIS — E538 Deficiency of other specified B group vitamins: Secondary | ICD-10-CM

## 2022-04-06 NOTE — Progress Notes (Signed)
Subjective:  Patient ID: Tommy [name], male    DOB: 1986-01-24  Age: 37 y.o. MRN: [id number]  CC: Annual Exam (Physical. )   HPI Tommy [name] presents for a well exam  Outpatient Medications Prior to Visit  Medication Sig Dispense Refill   PARoxetine (PAXIL) 40 MG tablet Take 1 by mouth daily for Depression. Annual appt is due must see provider for future refills 90 tablet 0   albuterol (VENTOLIN HFA) 108 (90 Base) MCG/ACT inhaler Inhale 2 puffs into the lungs every 4 (four) hours as needed for wheezing or shortness of breath. (Patient not taking: Reported on 04/06/2022) 1 each 0   Cholecalciferol (VITAMIN D3) 50 MCG (2000 UT) capsule Take 1 capsule (2,000 Units total) by mouth daily. (Patient not taking: Reported on 04/06/2022) 100 capsule 3   Cyanocobalamin (B-12) 5000 MCG SUBL Take 1 tablet sublingually daily (Patient not taking: Reported on 04/06/2022) 30 tablet 1   hydrOXYzine (ATARAX/VISTARIL) 25 MG tablet Take 1 tablet (25 mg total) by mouth 3 (three) times daily as needed for anxiety. (Patient not taking: Reported on 04/06/2022) 75 tablet 0   Multiple Vitamin (MULTIVITAMIN WITH MINERALS) TABS tablet Take 1 tablet by mouth daily. Vitamin supplement (Patient not taking: Reported on 04/06/2022) 1 tablet 0   traZODone (DESYREL) 50 MG tablet Take 1 tablet (50 mg total) by mouth at bedtime as needed for sleep. (Patient not taking: Reported on 04/06/2022) 30 tablet 0   Vitamin D, Ergocalciferol, (DRISDOL) 1.25 MG (50000 UNIT) CAPS capsule Take 1 capsule (50,000 Units total) by mouth every 7 (seven) days. (Patient not taking: Reported on 04/06/2022) 6 capsule 0   No facility-administered medications prior to visit.    ROS: Review of Systems  Objective:  BP (!) 130/90 (BP Location: Right Arm, Patient Position: Sitting, Cuff Size: Large)   Pulse 94   Temp 98.8 F (37.1 C) (Oral)   Ht 5\' 10"  (1.778 m)   Wt (!) 331 lb 6.4 oz (150.3 kg)   SpO2 97%   BMI 47.55 kg/m   BP Readings  from Last 3 Encounters:  04/06/22 (!) 130/90  03/04/22 123/88  02/25/22 134/89    Wt Readings from Last 3 Encounters:  04/06/22 (!) 331 lb 6.4 oz (150.3 kg)  07/13/20 (!) 313 lb (142 kg)  07/08/20 (!) 313 lb 9.6 oz (142.2 kg)    Physical Exam  Lab Results  Component Value Date   WBC 7.6 04/06/2022   HGB 14.2 04/06/2022   HCT 42.4 04/06/2022   PLT 332.0 04/06/2022   GLUCOSE 88 04/06/2022   CHOL 193 04/06/2022   TRIG 91.0 04/06/2022   HDL 70.00 04/06/2022   LDLCALC 105 (H) 04/06/2022   ALT 23 04/06/2022   AST 20 04/06/2022   NA 139 04/06/2022   K 4.4 04/06/2022   CL 101 04/06/2022   CREATININE 1.05 04/06/2022   BUN 20 04/06/2022   CO2 27 04/06/2022   TSH 1.61 04/06/2022   PSA 0.40 04/06/2022   HGBA1C 5.8 04/06/2022    DG Chest 2 View  Result Date: 03/04/2022 CLINICAL DATA:  Cough and wheezing. EXAM: CHEST - 2 VIEW COMPARISON:  None Available. FINDINGS: The heart size and mediastinal contours are within normal limits. Both lungs are clear. The visualized skeletal structures are unremarkable. IMPRESSION: No active cardiopulmonary disease. Electronically Signed   By: Keane Police D.O.   On: 03/04/2022 09:39    Assessment & Plan:   Problem List Items Addressed This Visit  Respiratory   Asthmatic bronchitis    One episode - resolved        Other   Situational anxiety    Cont on Paxil      Relevant Orders   CBC with Differential/Platelet (Completed)   Lipid panel (Completed)   Vitamin B12 (Completed)   Vitamin B12 deficiency    Obtain vitamin B12 level.  Will treat if low B12      Vitamin D deficiency    Obtain vitamin D level.  Will treat if low vitamin D      Relevant Orders   VITAMIN D 25 Hydroxy (Vit-D Deficiency, Fractures) (Completed)   Well adult exam - Primary     We discussed age appropriate health related issues, including available/recomended screening tests and vaccinations. Labs were ordered to be later reviewed . All questions  were answered. We discussed one or more of the following - seat belt use, use of sunscreen/sun exposure exercise, fall risk reduction, second hand smoke exposure, firearm use and storage, seat belt use, a need for adhering to healthy diet and exercise. Labs were ordered.  All questions were answered.       Relevant Orders   TSH (Completed)   Urinalysis   CBC with Differential/Platelet (Completed)   Lipid panel (Completed)   PSA (Completed)   Comprehensive metabolic panel (Completed)   Vitamin B12 (Completed)   VITAMIN D 25 Hydroxy (Vit-D Deficiency, Fractures) (Completed)   Hemoglobin A1c (Completed)   Other Visit Diagnoses     Hyperglycemia       Relevant Orders   Hemoglobin A1c (Completed)         No orders of the defined types were placed in this encounter.     Follow-up: Return in about 6 months (around 10/05/2022) for a follow-up visit.  Walker Kehr, MD

## 2022-04-06 NOTE — Assessment & Plan Note (Signed)
Cont on Paxil. 

## 2022-04-06 NOTE — Patient Instructions (Signed)
Tommy [name]

## 2022-04-06 NOTE — Assessment & Plan Note (Signed)
One episode - resolved

## 2022-04-06 NOTE — Assessment & Plan Note (Signed)

## 2022-04-07 LAB — LIPID PANEL
Cholesterol: 193 mg/dL (ref 0–200)
HDL: 70 mg/dL (ref >39.00)
LDL Cholesterol: 105 mg/dL — ABNORMAL HIGH (ref 0–99)
NonHDL: 122.8
Total CHOL/HDL Ratio: 3
Triglycerides: 91 mg/dL (ref 0.0–149.0)
VLDL: 18.2 mg/dL (ref 0.0–40.0)

## 2022-04-07 LAB — URINALYSIS, ROUTINE W REFLEX MICROSCOPIC
Bilirubin Urine: NEGATIVE
Ketones, ur: NEGATIVE
Leukocytes,Ua: NEGATIVE
Nitrite: NEGATIVE
Specific Gravity, Urine: 1.025 (ref 1.000–1.030)
Total Protein, Urine: NEGATIVE
Urine Glucose: NEGATIVE
Urobilinogen, UA: 0.2 (ref 0.0–1.0)
pH: 6 (ref 5.0–8.0)

## 2022-04-07 LAB — CBC WITH DIFFERENTIAL/PLATELET
Basophils Absolute: 0.1 10*3/uL (ref 0.0–0.1)
Basophils Relative: 1.1 % (ref 0.0–3.0)
Eosinophils Absolute: 0.2 10*3/uL (ref 0.0–0.7)
Eosinophils Relative: 2.2 % (ref 0.0–5.0)
HCT: 42.4 % (ref 39.0–52.0)
Hemoglobin: 14.2 g/dL (ref 13.0–17.0)
Lymphocytes Relative: 30.7 % (ref 12.0–46.0)
Lymphs Abs: 2.3 10*3/uL (ref 0.7–4.0)
MCHC: 33.5 g/dL (ref 30.0–36.0)
MCV: 89.6 fl (ref 78.0–100.0)
Monocytes Absolute: 0.7 10*3/uL (ref 0.1–1.0)
Monocytes Relative: 9 % (ref 3.0–12.0)
Neutro Abs: 4.3 10*3/uL (ref 1.4–7.7)
Neutrophils Relative %: 57 % (ref 43.0–77.0)
Platelets: 332 10*3/uL (ref 150.0–400.0)
RBC: 4.74 Mil/uL (ref 4.22–5.81)
RDW: 14.2 % (ref 11.5–15.5)
WBC: 7.6 10*3/uL (ref 4.0–10.5)

## 2022-04-07 LAB — COMPREHENSIVE METABOLIC PANEL
ALT: 23 U/L (ref 0–53)
AST: 20 U/L (ref 0–37)
Albumin: 4.7 g/dL (ref 3.5–5.2)
Alkaline Phosphatase: 50 U/L (ref 39–117)
BUN: 20 mg/dL (ref 6–23)
CO2: 27 mEq/L (ref 19–32)
Calcium: 10.3 mg/dL (ref 8.4–10.5)
Chloride: 101 mEq/L (ref 96–112)
Creatinine, Ser: 1.05 mg/dL (ref 0.40–1.50)
GFR: 90.32 mL/min (ref >60.00)
Glucose, Bld: 88 mg/dL (ref 70–99)
Potassium: 4.4 mEq/L (ref 3.5–5.1)
Sodium: 139 mEq/L (ref 135–145)
Total Bilirubin: 0.4 mg/dL (ref 0.2–1.2)
Total Protein: 7.8 g/dL (ref 6.0–8.3)

## 2022-04-07 LAB — HEMOGLOBIN A1C: Hgb A1c MFr Bld: 5.8 % (ref 4.6–6.5)

## 2022-04-07 LAB — PSA: PSA: 0.4 ng/mL (ref 0.10–4.00)

## 2022-04-07 LAB — VITAMIN D 25 HYDROXY (VIT D DEFICIENCY, FRACTURES): VITD: 15.86 ng/mL — ABNORMAL LOW (ref 30.00–100.00)

## 2022-04-07 LAB — VITAMIN B12: Vitamin B-12: 157 pg/mL — ABNORMAL LOW (ref 211–911)

## 2022-04-07 LAB — TSH: TSH: 1.61 u[IU]/mL (ref 0.35–5.50)

## 2022-04-10 ENCOUNTER — Other Ambulatory Visit: Payer: Self-pay | Admitting: Internal Medicine

## 2022-04-10 DIAGNOSIS — E538 Deficiency of other specified B group vitamins: Secondary | ICD-10-CM | POA: Insufficient documentation

## 2022-04-10 DIAGNOSIS — E559 Vitamin D deficiency, unspecified: Secondary | ICD-10-CM

## 2022-04-10 MED ORDER — VITAMIN D3 50 MCG (2000 UT) PO CAPS: 2000.0000 [IU] | ORAL_CAPSULE | Freq: Every day | ORAL | 3 refills | Status: DC

## 2022-04-10 MED ORDER — VITAMIN B-12 1000 MCG SL SUBL: 1.0000 | SUBLINGUAL_TABLET | Freq: Every day | SUBLINGUAL | 3 refills | Status: DC

## 2022-04-10 MED ORDER — VITAMIN D (ERGOCALCIFEROL) 1.25 MG (50000 UNIT) PO CAPS: 50000.0000 [IU] | ORAL_CAPSULE | ORAL | 0 refills | Status: DC

## 2022-04-10 NOTE — Assessment & Plan Note (Signed)
Chronic.  Started on vitamin D3 at high-dose followed by 2000 units daily Risks associated with treatment noncompliance were discussed. Compliance was encouraged.

## 2022-04-10 NOTE — Assessment & Plan Note (Signed)
Start vitamin B12 sublingual.  Injections offered

## 2022-04-10 NOTE — Assessment & Plan Note (Signed)
Obtain vitamin D level.  Will treat if low vitamin D

## 2022-04-10 NOTE — Assessment & Plan Note (Signed)
Obtain vitamin B12 level.  Will treat if low B12

## 2022-09-08 ENCOUNTER — Other Ambulatory Visit: Payer: Self-pay | Admitting: Internal Medicine

## 2022-09-08 ENCOUNTER — Other Ambulatory Visit: Payer: Self-pay

## 2022-09-08 ENCOUNTER — Other Ambulatory Visit (HOSPITAL_COMMUNITY): Payer: Self-pay

## 2022-09-08 DIAGNOSIS — F418 Other specified anxiety disorders: Secondary | ICD-10-CM

## 2022-09-08 MED ORDER — PAROXETINE HCL 40 MG PO TABS
40.0000 mg | ORAL_TABLET | Freq: Every day | ORAL | 1 refills | Status: DC
  Filled 2022-09-08: qty 30, 30d supply, fill #0

## 2022-10-05 ENCOUNTER — Encounter: Payer: Self-pay | Admitting: Internal Medicine

## 2022-10-05 ENCOUNTER — Ambulatory Visit: Payer: Managed Care, Other (non HMO) | Admitting: Internal Medicine

## 2022-10-05 VITALS — BP 120/90 | HR 75 | Temp 98.6°F | Ht 70.0 in | Wt 330.0 lb

## 2022-10-05 DIAGNOSIS — R739 Hyperglycemia, unspecified: Secondary | ICD-10-CM

## 2022-10-05 DIAGNOSIS — E559 Vitamin D deficiency, unspecified: Secondary | ICD-10-CM

## 2022-10-05 DIAGNOSIS — E538 Deficiency of other specified B group vitamins: Secondary | ICD-10-CM

## 2022-10-05 MED ORDER — VITAMIN D3 50 MCG (2000 UT) PO CAPS: 2000.0000 [IU] | ORAL_CAPSULE | Freq: Every day | ORAL | 3 refills | Status: AC

## 2022-10-05 MED ORDER — VITAMIN D (ERGOCALCIFEROL) 1.25 MG (50000 UNIT) PO CAPS: 50000.0000 [IU] | ORAL_CAPSULE | ORAL | 0 refills | Status: DC

## 2022-10-05 MED ORDER — VITAMIN B-12 1000 MCG SL SUBL: 1.0000 | SUBLINGUAL_TABLET | Freq: Every day | SUBLINGUAL | 3 refills | Status: DC

## 2022-10-05 NOTE — Progress Notes (Signed)
Subjective:  Patient ID: Tommy [name], male    DOB: 08/31/1985  Age: 37 y.o. MRN: [id number]  CC: Follow-up (6 mnth f/u)   HPI Tommy [name] presents for B12 def, Vit D def, anxiety Not taking   Outpatient Medications Prior to Visit  Medication Sig Dispense Refill   PARoxetine (PAXIL) 40 MG tablet Take 1 tablet (40 mg total) by mouth daily for depression. 90 tablet 1   Cholecalciferol (VITAMIN D3) 50 MCG (2000 UT) capsule Take 1 capsule (2,000 Units total) by mouth daily. 100 capsule 3   Cyanocobalamin (VITAMIN B-12) 1000 MCG SUBL Place 1 tablet (1,000 mcg total) under the tongue daily. 100 tablet 3   albuterol (VENTOLIN HFA) 108 (90 Base) MCG/ACT inhaler Inhale 2 puffs into the lungs every 4 (four) hours as needed for wheezing or shortness of breath. (Patient not taking: Reported on 04/06/2022) 1 each 0   Vitamin D, Ergocalciferol, (DRISDOL) 1.25 MG (50000 UNIT) CAPS capsule Take 1 capsule (50,000 Units total) by mouth every 7 (seven) days. 8 capsule 0   No facility-administered medications prior to visit.    ROS: Review of Systems  Constitutional:  Negative for appetite change, fatigue and unexpected weight change.  HENT:  Negative for congestion, nosebleeds, sneezing, sore throat and trouble swallowing.   Eyes:  Negative for itching and visual disturbance.  Respiratory:  Negative for cough.   Cardiovascular:  Negative for chest pain, palpitations and leg swelling.  Gastrointestinal:  Negative for abdominal distention, blood in stool, diarrhea and nausea.  Genitourinary:  Negative for frequency and hematuria.  Musculoskeletal:  Negative for back pain, gait problem, joint swelling and neck pain.  Skin:  Negative for rash.  Neurological:  Negative for dizziness, tremors, speech difficulty and weakness.  Psychiatric/Behavioral:  Negative for agitation, dysphoric mood, sleep disturbance and suicidal ideas. The patient is nervous/anxious.     Objective:  BP (!) 120/90 (BP  Location: Left Arm, Patient Position: Sitting, Cuff Size: Normal)   Pulse 75   Temp 98.6 F (37 C) (Oral)   Ht 5\' 10"  (1.778 m)   Wt (!) 330 lb (149.7 kg)   SpO2 99%   BMI 47.35 kg/m   BP Readings from Last 3 Encounters:  10/05/22 (!) 120/90  04/06/22 (!) 130/90  03/04/22 123/88    Wt Readings from Last 3 Encounters:  10/05/22 (!) 330 lb (149.7 kg)  04/06/22 (!) 331 lb 6.4 oz (150.3 kg)  07/13/20 (!) 313 lb (142 kg)    Physical Exam Constitutional:      General: He is not in acute distress.    Appearance: He is well-developed.     Comments: NAD  Eyes:     Conjunctiva/sclera: Conjunctivae normal.     Pupils: Pupils are equal, round, and reactive to light.  Neck:     Thyroid: No thyromegaly.     Vascular: No JVD.  Cardiovascular:     Rate and Rhythm: Normal rate and regular rhythm.     Heart sounds: Normal heart sounds. No murmur heard.    No friction rub. No gallop.  Pulmonary:     Effort: Pulmonary effort is normal. No respiratory distress.     Breath sounds: Normal breath sounds. No wheezing or rales.  Chest:     Chest wall: No tenderness.  Abdominal:     General: Bowel sounds are normal. There is no distension.     Palpations: Abdomen is soft. There is no mass.     Tenderness: There is  no abdominal tenderness. There is no guarding or rebound.  Musculoskeletal:        General: No tenderness. Normal range of motion.     Cervical back: Normal range of motion.  Lymphadenopathy:     Cervical: No cervical adenopathy.  Skin:    General: Skin is warm and dry.     Findings: No rash.  Neurological:     Mental Status: He is alert and oriented to person, place, and time.     Cranial Nerves: No cranial nerve deficit.     Motor: No abnormal muscle tone.     Coordination: Coordination normal.     Gait: Gait normal.     Deep Tendon Reflexes: Reflexes are normal and symmetric.  Psychiatric:        Behavior: Behavior normal.        Thought Content: Thought content  normal.        Judgment: Judgment normal.     Lab Results  Component Value Date   WBC 7.6 04/06/2022   HGB 14.2 04/06/2022   HCT 42.4 04/06/2022   PLT 332.0 04/06/2022   GLUCOSE 88 04/06/2022   CHOL 193 04/06/2022   TRIG 91.0 04/06/2022   HDL 70.00 04/06/2022   LDLCALC 105 (H) 04/06/2022   ALT 23 04/06/2022   AST 20 04/06/2022   NA 139 04/06/2022   K 4.4 04/06/2022   CL 101 04/06/2022   CREATININE 1.05 04/06/2022   BUN 20 04/06/2022   CO2 27 04/06/2022   TSH 1.61 04/06/2022   PSA 0.40 04/06/2022   HGBA1C 5.8 04/06/2022    DG Chest 2 View  Result Date: 03/04/2022 CLINICAL DATA:  Cough and wheezing. EXAM: CHEST - 2 VIEW COMPARISON:  None Available. FINDINGS: The heart size and mediastinal contours are within normal limits. Both lungs are clear. The visualized skeletal structures are unremarkable. IMPRESSION: No active cardiopulmonary disease. Electronically Signed   By: Larose Hires D.O.   On: 03/04/2022 09:39    Assessment & Plan:   Problem List Items Addressed This Visit     Vitamin D deficiency - Primary   Relevant Orders   VITAMIN D 25 Hydroxy (Vit-D Deficiency, Fractures)   Vitamin B12 deficiency   Relevant Orders   Vitamin B12   Other Visit Diagnoses     Hyperglycemia       Relevant Orders   CBC with Differential/Platelet   Comprehensive metabolic panel   T4, free   Hemoglobin A1c         Meds ordered this encounter  Medications   Cholecalciferol (VITAMIN D3) 50 MCG (2000 UT) capsule    Sig: Take 1 capsule (2,000 Units total) by mouth daily.    Dispense:  100 capsule    Refill:  3   Cyanocobalamin (VITAMIN B-12) 1000 MCG SUBL    Sig: Place 1 tablet (1,000 mcg total) under the tongue daily.    Dispense:  100 tablet    Refill:  3   Vitamin D, Ergocalciferol, (DRISDOL) 1.25 MG (50000 UNIT) CAPS capsule    Sig: Take 1 capsule (50,000 Units total) by mouth every 7 (seven) days.    Dispense:  8 capsule    Refill:  0      Follow-up: Return in  about 3 months (around 01/05/2023) for a follow-up visit.  Sonda Primes, MD

## 2023-01-05 ENCOUNTER — Ambulatory Visit: Payer: Managed Care, Other (non HMO) | Admitting: Internal Medicine

## 2023-01-05 ENCOUNTER — Other Ambulatory Visit (INDEPENDENT_AMBULATORY_CARE_PROVIDER_SITE_OTHER): Payer: Managed Care, Other (non HMO)

## 2023-01-05 VITALS — BP 118/80 | HR 80 | Temp 98.6°F | Ht 70.0 in | Wt 331.0 lb

## 2023-01-05 DIAGNOSIS — E538 Deficiency of other specified B group vitamins: Secondary | ICD-10-CM

## 2023-01-05 DIAGNOSIS — E559 Vitamin D deficiency, unspecified: Secondary | ICD-10-CM

## 2023-01-05 DIAGNOSIS — F339 Major depressive disorder, recurrent, unspecified: Secondary | ICD-10-CM

## 2023-01-05 DIAGNOSIS — Z113 Encounter for screening for infections with a predominantly sexual mode of transmission: Secondary | ICD-10-CM

## 2023-01-05 DIAGNOSIS — R21 Rash and other nonspecific skin eruption: Secondary | ICD-10-CM | POA: Insufficient documentation

## 2023-01-05 DIAGNOSIS — R739 Hyperglycemia, unspecified: Secondary | ICD-10-CM

## 2023-01-05 LAB — CBC WITH DIFFERENTIAL/PLATELET
Basophils Absolute: 0 10*3/uL (ref 0.0–0.1)
Basophils Relative: 0.5 % (ref 0.0–3.0)
Eosinophils Absolute: 0.1 10*3/uL (ref 0.0–0.7)
Eosinophils Relative: 1.8 % (ref 0.0–5.0)
HCT: 46.6 % (ref 39.0–52.0)
Hemoglobin: 14.9 g/dL (ref 13.0–17.0)
Lymphocytes Relative: 25.8 % (ref 12.0–46.0)
Lymphs Abs: 1.6 10*3/uL (ref 0.7–4.0)
MCHC: 31.9 g/dL (ref 30.0–36.0)
MCV: 90.7 fL (ref 78.0–100.0)
Monocytes Absolute: 0.6 10*3/uL (ref 0.1–1.0)
Monocytes Relative: 9.5 % (ref 3.0–12.0)
Neutro Abs: 3.8 10*3/uL (ref 1.4–7.7)
Neutrophils Relative %: 62.4 % (ref 43.0–77.0)
Platelets: 291 10*3/uL (ref 150.0–400.0)
RBC: 5.13 Mil/uL (ref 4.22–5.81)
RDW: 13.5 % (ref 11.5–15.5)
WBC: 6.1 10*3/uL (ref 4.0–10.5)

## 2023-01-05 MED ORDER — CLOTRIMAZOLE-BETAMETHASONE 1-0.05 % EX CREA: 1.0000 | TOPICAL_CREAM | Freq: Two times a day (BID) | CUTANEOUS | 3 refills | Status: AC

## 2023-01-05 NOTE — Assessment & Plan Note (Signed)
Start vitamin B12 sublingual.  Injections offered. Risks associated with treatment noncompliance were discussed. Compliance was encouraged.

## 2023-01-05 NOTE — Assessment & Plan Note (Signed)
Pt declined Paxil Start councelling

## 2023-01-05 NOTE — Assessment & Plan Note (Signed)
flaky rash in the beard area Lotrisone cream bid prn

## 2023-01-05 NOTE — Progress Notes (Signed)
Subjective:  Patient ID: Tommy [name], male    DOB: 1985-10-28  Age: 37 y.o. MRN: [id number]  CC: Medical Management of Chronic Issues (3 [facility] F/U)   HPI Tommy [name] presents for low B12, Low Vit D, tachycardia C/o flaky rash in the beard area Jackson asked for an STD check   Outpatient Medications Prior to Visit  Medication Sig Dispense Refill   albuterol (VENTOLIN HFA) 108 (90 Base) MCG/ACT inhaler Inhale 2 puffs into the lungs every 4 (four) hours as needed for wheezing or shortness of breath. (Patient not taking: Reported on 04/06/2022) 1 each 0   Cholecalciferol (VITAMIN D3) 50 MCG (2000 UT) capsule Take 1 capsule (2,000 Units total) by mouth daily. 100 capsule 3   Cyanocobalamin (VITAMIN B-12) 1000 MCG SUBL Place 1 tablet (1,000 mcg total) under the tongue daily. 100 tablet 3   Vitamin D, Ergocalciferol, (DRISDOL) 1.25 MG (50000 UNIT) CAPS capsule Take 1 capsule (50,000 Units total) by mouth every 7 (seven) days. 8 capsule 0   PARoxetine (PAXIL) 40 MG tablet Take 1 tablet (40 mg total) by mouth daily for depression. 90 tablet 1   No facility-administered medications prior to visit.    ROS: Review of Systems  Constitutional:  Negative for appetite change, fatigue and unexpected weight change.  HENT:  Negative for congestion, nosebleeds, sneezing, sore throat and trouble swallowing.   Eyes:  Negative for itching and visual disturbance.  Respiratory:  Negative for cough.   Cardiovascular:  Negative for chest pain, palpitations and leg swelling.  Gastrointestinal:  Negative for abdominal distention, blood in stool, diarrhea and nausea.  Genitourinary:  Negative for frequency and hematuria.  Musculoskeletal:  Negative for back pain, gait problem, joint swelling and neck pain.  Skin:  Negative for rash.  Neurological:  Negative for dizziness, tremors, speech difficulty and weakness.  Psychiatric/Behavioral:  Positive for dysphoric mood. Negative for agitation, self-injury,  sleep disturbance and suicidal ideas. The patient is nervous/anxious.     Objective:  BP 118/80 (BP Location: Left Arm, Patient Position: Sitting, Cuff Size: Normal)   Pulse 80   Temp 98.6 F (37 C) (Oral)   Ht 5\' 10"  (1.778 m)   Wt (!) 331 lb (150.1 kg)   SpO2 97%   BMI 47.49 kg/m   BP Readings from Last 3 Encounters:  01/05/23 118/80  10/05/22 (!) 120/90  04/06/22 (!) 130/90    Wt Readings from Last 3 Encounters:  01/05/23 (!) 331 lb (150.1 kg)  10/05/22 (!) 330 lb (149.7 kg)  04/06/22 (!) 331 lb 6.4 oz (150.3 kg)    Physical Exam Constitutional:      General: He is not in acute distress.    Appearance: He is well-developed. He is obese.     Comments: NAD  Eyes:     Conjunctiva/sclera: Conjunctivae normal.     Pupils: Pupils are equal, round, and reactive to light.  Neck:     Thyroid: No thyromegaly.     Vascular: No JVD.  Cardiovascular:     Rate and Rhythm: Normal rate and regular rhythm.     Heart sounds: Normal heart sounds. No murmur heard.    No friction rub. No gallop.  Pulmonary:     Effort: Pulmonary effort is normal. No respiratory distress.     Breath sounds: Normal breath sounds. No wheezing or rales.  Chest:     Chest wall: No tenderness.  Abdominal:     General: Bowel sounds are normal. There is no  distension.     Palpations: Abdomen is soft. There is no mass.     Tenderness: There is no abdominal tenderness. There is no guarding or rebound.  Musculoskeletal:        General: No tenderness. Normal range of motion.     Cervical back: Normal range of motion.  Lymphadenopathy:     Cervical: No cervical adenopathy.  Skin:    General: Skin is warm and dry.     Findings: Erythema and rash present.  Neurological:     Mental Status: He is alert and oriented to person, place, and time.     Cranial Nerves: No cranial nerve deficit.     Motor: No abnormal muscle tone.     Coordination: Coordination normal.     Gait: Gait normal.     Deep Tendon  Reflexes: Reflexes are normal and symmetric.  Psychiatric:        Behavior: Behavior normal.        Thought Content: Thought content normal.        Judgment: Judgment normal.   flaky rash in the beard area  Lab Results  Component Value Date   WBC 7.6 04/06/2022   HGB 14.2 04/06/2022   HCT 42.4 04/06/2022   PLT 332.0 04/06/2022   GLUCOSE 88 04/06/2022   CHOL 193 04/06/2022   TRIG 91.0 04/06/2022   HDL 70.00 04/06/2022   LDLCALC 105 (H) 04/06/2022   ALT 23 04/06/2022   AST 20 04/06/2022   NA 139 04/06/2022   K 4.4 04/06/2022   CL 101 04/06/2022   CREATININE 1.05 04/06/2022   BUN 20 04/06/2022   CO2 27 04/06/2022   TSH 1.61 04/06/2022   PSA 0.40 04/06/2022   HGBA1C 5.8 04/06/2022    DG Chest 2 View  Result Date: 03/04/2022 CLINICAL DATA:  Cough and wheezing. EXAM: CHEST - 2 VIEW COMPARISON:  None Available. FINDINGS: The heart size and mediastinal contours are within normal limits. Both lungs are clear. The visualized skeletal structures are unremarkable. IMPRESSION: No active cardiopulmonary disease. Electronically Signed   By: Larose Hires D.O.   On: 03/04/2022 09:39    Assessment & Plan:   Problem List Items Addressed This Visit     Vitamin D deficiency - Primary    Obtain vitamin D level.  Will treat if low vitamin D      Major depression, recurrent, chronic (HCC)    Pt declined Paxil Start councelling      Vitamin B12 deficiency    Start vitamin B12 sublingual.  Injections offered. Risks associated with treatment noncompliance were discussed. Compliance was encouraged.      Rash    flaky rash in the beard area Lotrisone cream bid prn      Other Visit Diagnoses     Routine screening for STI (sexually transmitted infection)       Relevant Orders   HIV Antibody (routine testing w rflx)   GC/Chlamydia Probe Amp   RPR         Meds ordered this encounter  Medications   clotrimazole-betamethasone (LOTRISONE) cream    Sig: Apply 1 Application  topically 2 (two) times daily.    Dispense:  60 g    Refill:  3      Follow-up: Return in about 6 months (around 07/06/2023) for Wellness Exam.  Sonda Primes, MD

## 2023-01-05 NOTE — Assessment & Plan Note (Signed)
Obtain vitamin D level.  Will treat if low vitamin D

## 2023-01-05 NOTE — Patient Instructions (Addendum)
We need to do lab orders from 09/13/22 and from today

## 2023-01-06 LAB — HEMOGLOBIN A1C: Hgb A1c MFr Bld: 5.9 % (ref 4.6–6.5)

## 2023-01-06 LAB — VITAMIN B12: Vitamin B-12: 363 pg/mL (ref 211–911)

## 2023-01-06 LAB — HIV ANTIBODY (ROUTINE TESTING W REFLEX): HIV 1&2 Ab, 4th Generation: NONREACTIVE

## 2023-01-06 LAB — COMPREHENSIVE METABOLIC PANEL
ALT: 15 U/L (ref 0–53)
AST: 17 U/L (ref 0–37)
Albumin: 4.5 g/dL (ref 3.5–5.2)
Alkaline Phosphatase: 41 U/L (ref 39–117)
BUN: 14 mg/dL (ref 6–23)
CO2: 26 meq/L (ref 19–32)
Calcium: 9.8 mg/dL (ref 8.4–10.5)
Chloride: 103 meq/L (ref 96–112)
Creatinine, Ser: 1.33 mg/dL (ref 0.40–1.50)
GFR: 67.66 mL/min (ref >60.00)
Glucose, Bld: 88 mg/dL (ref 70–99)
Potassium: 4.2 meq/L (ref 3.5–5.1)
Sodium: 141 meq/L (ref 135–145)
Total Bilirubin: 0.4 mg/dL (ref 0.2–1.2)
Total Protein: 7.7 g/dL (ref 6.0–8.3)

## 2023-01-06 LAB — T4, FREE: Free T4: 0.87 ng/dL (ref 0.60–1.60)

## 2023-01-06 LAB — RPR: RPR Ser Ql: NONREACTIVE

## 2023-01-06 LAB — VITAMIN D 25 HYDROXY (VIT D DEFICIENCY, FRACTURES): VITD: 24.18 ng/mL — ABNORMAL LOW (ref 30.00–100.00)

## 2023-01-09 MED ORDER — VITAMIN D (ERGOCALCIFEROL) 1.25 MG (50000 UNIT) PO CAPS: 50000.0000 [IU] | ORAL_CAPSULE | ORAL | 0 refills | Status: AC

## 2023-01-10 LAB — GC/CHLAMYDIA PROBE AMP
Chlamydia trachomatis, NAA: NEGATIVE
Neisseria Gonorrhoeae by PCR: NEGATIVE

## 2023-06-13 ENCOUNTER — Encounter: Admitting: Internal Medicine

## 2023-07-27 ENCOUNTER — Encounter: Payer: Self-pay | Admitting: Internal Medicine

## 2023-07-27 ENCOUNTER — Ambulatory Visit: Admitting: Internal Medicine

## 2023-07-27 VITALS — BP 116/68 | HR 98 | Temp 98.2°F | Ht 70.0 in | Wt 338.0 lb

## 2023-07-27 DIAGNOSIS — Z0000 Encounter for general adult medical examination without abnormal findings: Secondary | ICD-10-CM

## 2023-07-27 DIAGNOSIS — M7662 Achilles tendinitis, left leg: Secondary | ICD-10-CM

## 2023-07-27 DIAGNOSIS — E559 Vitamin D deficiency, unspecified: Secondary | ICD-10-CM | POA: Diagnosis not present

## 2023-07-27 DIAGNOSIS — E538 Deficiency of other specified B group vitamins: Secondary | ICD-10-CM

## 2023-07-27 DIAGNOSIS — M7660 Achilles tendinitis, unspecified leg: Secondary | ICD-10-CM | POA: Insufficient documentation

## 2023-07-27 LAB — LIPID PANEL
Cholesterol: 177 mg/dL (ref 0–200)
HDL: 60.5 mg/dL (ref >39.00)
LDL Cholesterol: 86 mg/dL (ref 0–99)
NonHDL: 116.75
Total CHOL/HDL Ratio: 3
Triglycerides: 156 mg/dL — ABNORMAL HIGH (ref 0.0–149.0)
VLDL: 31.2 mg/dL (ref 0.0–40.0)

## 2023-07-27 LAB — URINALYSIS, ROUTINE W REFLEX MICROSCOPIC
Bilirubin Urine: NEGATIVE
Leukocytes,Ua: NEGATIVE
Nitrite: NEGATIVE
Specific Gravity, Urine: 1.025 (ref 1.000–1.030)
Urine Glucose: NEGATIVE
Urobilinogen, UA: 2 — AB (ref 0.0–1.0)
WBC, UA: NONE SEEN (ref 0–?)
pH: 7 (ref 5.0–8.0)

## 2023-07-27 LAB — COMPREHENSIVE METABOLIC PANEL WITH GFR
ALT: 18 U/L (ref 0–53)
AST: 17 U/L (ref 0–37)
Albumin: 4.5 g/dL (ref 3.5–5.2)
Alkaline Phosphatase: 49 U/L (ref 39–117)
BUN: 16 mg/dL (ref 6–23)
CO2: 28 meq/L (ref 19–32)
Calcium: 9.6 mg/dL (ref 8.4–10.5)
Chloride: 103 meq/L (ref 96–112)
Creatinine, Ser: 1.3 mg/dL (ref 0.40–1.50)
GFR: 69.26 mL/min (ref >60.00)
Glucose, Bld: 84 mg/dL (ref 70–99)
Potassium: 4.3 meq/L (ref 3.5–5.1)
Sodium: 138 meq/L (ref 135–145)
Total Bilirubin: 0.4 mg/dL (ref 0.2–1.2)
Total Protein: 7.6 g/dL (ref 6.0–8.3)

## 2023-07-27 LAB — CBC WITH DIFFERENTIAL/PLATELET
Basophils Absolute: 0 10*3/uL (ref 0.0–0.1)
Basophils Relative: 0.6 % (ref 0.0–3.0)
Eosinophils Absolute: 0.1 10*3/uL (ref 0.0–0.7)
Eosinophils Relative: 1.7 % (ref 0.0–5.0)
HCT: 43.1 % (ref 39.0–52.0)
Hemoglobin: 14.3 g/dL (ref 13.0–17.0)
Lymphocytes Relative: 22.2 % (ref 12.0–46.0)
Lymphs Abs: 1.2 10*3/uL (ref 0.7–4.0)
MCHC: 33.1 g/dL (ref 30.0–36.0)
MCV: 89.3 fl (ref 78.0–100.0)
Monocytes Absolute: 0.5 10*3/uL (ref 0.1–1.0)
Monocytes Relative: 10.2 % (ref 3.0–12.0)
Neutro Abs: 3.5 10*3/uL (ref 1.4–7.7)
Neutrophils Relative %: 65.3 % (ref 43.0–77.0)
Platelets: 265 10*3/uL (ref 150.0–400.0)
RBC: 4.83 Mil/uL (ref 4.22–5.81)
RDW: 13.7 % (ref 11.5–15.5)
WBC: 5.4 10*3/uL (ref 4.0–10.5)

## 2023-07-27 LAB — TSH: TSH: 0.97 u[IU]/mL (ref 0.35–5.50)

## 2023-07-27 LAB — VITAMIN B12: Vitamin B-12: 251 pg/mL (ref 211–911)

## 2023-07-27 LAB — VITAMIN D 25 HYDROXY (VIT D DEFICIENCY, FRACTURES): VITD: 14.06 ng/mL — ABNORMAL LOW (ref 30.00–100.00)

## 2023-07-27 NOTE — Progress Notes (Signed)
 Subjective:  Patient ID: Tommy [name], male    DOB: 10-14-1985  Age: 38 y.o. MRN: [id number]  CC: Annual Exam   HPI Tommy [name] presents for L Achilles tendonitis - in PT now Well exam  Outpatient Medications Prior to Visit  Medication Sig Dispense Refill   Cholecalciferol  (VITAMIN D3) 50 MCG (2000 UT) capsule Take 1 capsule (2,000 Units total) by mouth daily. 100 capsule 3   clotrimazole -betamethasone  (LOTRISONE ) cream Apply 1 Application topically 2 (two) times daily. 60 g 3   Cyanocobalamin  (VITAMIN B-12) 1000 MCG SUBL Place 1 tablet (1,000 mcg total) under the tongue daily. 100 tablet 3   Vitamin D , Ergocalciferol , (DRISDOL ) 1.25 MG (50000 UNIT) CAPS capsule Take 1 capsule (50,000 Units total) by mouth every 7 (seven) days. 8 capsule 0   albuterol  (VENTOLIN  HFA) 108 (90 Base) MCG/ACT inhaler Inhale 2 puffs into the lungs every 4 (four) hours as needed for wheezing or shortness of breath. (Patient not taking: Reported on 07/27/2023) 1 each 0   No facility-administered medications prior to visit.    ROS: Review of Systems  Constitutional:  Positive for unexpected weight change. Negative for appetite change and fatigue.  HENT:  Negative for congestion, nosebleeds, sneezing, sore throat and trouble swallowing.   Eyes:  Negative for itching and visual disturbance.  Respiratory:  Negative for cough.   Cardiovascular:  Negative for chest pain, palpitations and leg swelling.  Gastrointestinal:  Negative for abdominal distention, blood in stool, diarrhea and nausea.  Genitourinary:  Negative for frequency and hematuria.  Musculoskeletal:  Negative for back pain, gait problem, joint swelling and neck pain.  Skin:  Negative for rash.  Neurological:  Negative for dizziness, tremors, speech difficulty and weakness.  Psychiatric/Behavioral:  Negative for agitation, dysphoric mood, sleep disturbance and suicidal ideas. The patient is not nervous/anxious.     Objective:  BP 116/68    Pulse 98   Temp 98.2 F (36.8 C) (Oral)   Ht 5\' 10"  (1.778 m)   Wt (!) 338 lb (153.3 kg)   SpO2 98%   BMI 48.50 kg/m   BP Readings from Last 3 Encounters:  07/27/23 116/68  01/05/23 118/80  10/05/22 (!) 120/90    Wt Readings from Last 3 Encounters:  07/27/23 (!) 338 lb (153.3 kg)  01/05/23 (!) 331 lb (150.1 kg)  10/05/22 (!) 330 lb (149.7 kg)    Physical Exam Constitutional:      General: He is not in acute distress.    Appearance: He is well-developed. He is obese.     Comments: NAD  Eyes:     Conjunctiva/sclera: Conjunctivae normal.     Pupils: Pupils are equal, round, and reactive to light.  Neck:     Thyroid : No thyromegaly.     Vascular: No JVD.  Cardiovascular:     Rate and Rhythm: Normal rate and regular rhythm.     Heart sounds: Normal heart sounds. No murmur heard.    No friction rub. No gallop.  Pulmonary:     Effort: Pulmonary effort is normal. No respiratory distress.     Breath sounds: Normal breath sounds. No wheezing or rales.  Chest:     Chest wall: No tenderness.  Abdominal:     General: Bowel sounds are normal. There is no distension.     Palpations: Abdomen is soft. There is no mass.     Tenderness: There is no abdominal tenderness. There is no guarding or rebound.  Musculoskeletal:  General: No tenderness. Normal range of motion.     Cervical back: Normal range of motion.  Lymphadenopathy:     Cervical: No cervical adenopathy.  Skin:    General: Skin is warm and dry.     Findings: No rash.  Neurological:     Mental Status: He is alert and oriented to person, place, and time.     Cranial Nerves: No cranial nerve deficit.     Motor: No abnormal muscle tone.     Coordination: Coordination normal.     Gait: Gait normal.     Deep Tendon Reflexes: Reflexes are normal and symmetric.  Psychiatric:        Behavior: Behavior normal.        Thought Content: Thought content normal.        Judgment: Judgment normal.   Testes - self  exam  Lab Results  Component Value Date   WBC 6.1 01/05/2023   HGB 14.9 01/05/2023   HCT 46.6 01/05/2023   PLT 291.0 01/05/2023   GLUCOSE 88 01/05/2023   CHOL 193 04/06/2022   TRIG 91.0 04/06/2022   HDL 70.00 04/06/2022   LDLCALC 105 (H) 04/06/2022   ALT 15 01/05/2023   AST 17 01/05/2023   NA 141 01/05/2023   K 4.2 01/05/2023   CL 103 01/05/2023   CREATININE 1.33 01/05/2023   BUN 14 01/05/2023   CO2 26 01/05/2023   TSH 1.61 04/06/2022   PSA 0.40 04/06/2022   HGBA1C 5.9 01/05/2023    DG Chest 2 View Result Date: 03/04/2022 CLINICAL DATA:  Cough and wheezing. EXAM: CHEST - 2 VIEW COMPARISON:  None Available. FINDINGS: The heart size and mediastinal contours are within normal limits. Both lungs are clear. The visualized skeletal structures are unremarkable. IMPRESSION: No active cardiopulmonary disease. Electronically Signed   By: Imran  Ahmed D.O.   On: 03/04/2022 09:39    Assessment & Plan:   Problem List Items Addressed This Visit     Vitamin D  deficiency   Obtain vitamin D  level.  On vitamin D       Well adult exam - Primary    We discussed age appropriate health related issues, including available/recomended screening tests and vaccinations. Labs were ordered to be later reviewed . All questions were answered. We discussed one or more of the following - seat belt use, use of sunscreen/sun exposure exercise, fall risk reduction, second hand smoke exposure, firearm use and storage, seat belt use, a need for adhering to healthy diet and exercise. Labs were ordered.  All questions were answered.       Relevant Orders   TSH   Urinalysis   CBC with Differential/Platelet   Lipid panel   Comprehensive metabolic panel with GFR   Vitamin B12 deficiency   On B12      Relevant Orders   Vitamin B12   VITAMIN D  25 Hydroxy (Vit-D Deficiency, Fractures)   Achilles tendinitis   In PT  Seeing a podiatrist at [facility]         No orders of the defined types were placed  in this encounter.     Follow-up: Return in about 1 year (around 07/26/2024) for a follow-up visit.  Anitra Barn, MD

## 2023-07-27 NOTE — Assessment & Plan Note (Signed)
 In PT  Seeing a podiatrist at [facility]

## 2023-07-27 NOTE — Assessment & Plan Note (Signed)
 On B12

## 2023-07-27 NOTE — Assessment & Plan Note (Signed)

## 2023-07-27 NOTE — Assessment & Plan Note (Signed)
 Obtain vitamin D  level.  On vitamin D 

## 2023-07-28 ENCOUNTER — Ambulatory Visit: Payer: Self-pay | Admitting: Internal Medicine

## 2023-07-28 DIAGNOSIS — R3129 Other microscopic hematuria: Secondary | ICD-10-CM

## 2023-07-28 MED ORDER — VITAMIN D (ERGOCALCIFEROL) 1.25 MG (50000 UNIT) PO CAPS: 50000.0000 [IU] | ORAL_CAPSULE | ORAL | 0 refills | Status: AC

## 2023-07-28 MED ORDER — VITAMIN B-12 1000 MCG SL SUBL: 2.0000 | SUBLINGUAL_TABLET | Freq: Every day | SUBLINGUAL | 3 refills | Status: AC

## 2023-09-25 ENCOUNTER — Other Ambulatory Visit: Payer: Self-pay | Admitting: Internal Medicine

## 2023-09-26 ENCOUNTER — Encounter: Payer: Self-pay | Admitting: Internal Medicine

## 2023-10-04 ENCOUNTER — Other Ambulatory Visit: Payer: Self-pay | Admitting: Urology

## 2023-10-04 DIAGNOSIS — R3129 Other microscopic hematuria: Secondary | ICD-10-CM

## 2023-10-19 ENCOUNTER — Ambulatory Visit
Admission: RE | Admit: 2023-10-19 | Discharge: 2023-10-19 | Disposition: A | Discharge status: Home / Self Care | Source: Ambulatory Visit | Attending: Urology

## 2023-10-19 DIAGNOSIS — R3129 Other microscopic hematuria: Secondary | ICD-10-CM

## 2023-10-26 ENCOUNTER — Other Ambulatory Visit

## 2024-03-20 ENCOUNTER — Other Ambulatory Visit: Payer: Self-pay
